# Patient Record
Sex: Male | Born: 1940 | Race: White | Hispanic: No | Marital: Married | Smoking: Former smoker
Health system: Southern US, Community
[De-identification: ages and names within clinical notes are randomized; demographics above are authoritative.]

---

## 2021-03-16 ENCOUNTER — Emergency Department: Payer: Medicare (Managed Care)

## 2021-03-16 ENCOUNTER — Emergency Department
Admission: EM | Admit: 2021-03-16 | Discharge: 2021-03-16 | Disposition: A | Discharge status: Home / Self Care | Payer: Medicare (Managed Care) | Attending: Emergency Medicine | Admitting: Emergency Medicine

## 2021-03-16 ENCOUNTER — Other Ambulatory Visit: Payer: Self-pay

## 2021-03-16 DIAGNOSIS — Z23 Encounter for immunization: Secondary | ICD-10-CM | POA: Insufficient documentation

## 2021-03-16 DIAGNOSIS — S0990XA Unspecified injury of head, initial encounter: Secondary | ICD-10-CM | POA: Diagnosis present

## 2021-03-16 DIAGNOSIS — Z7901 Long term (current) use of anticoagulants: Secondary | ICD-10-CM | POA: Insufficient documentation

## 2021-03-16 DIAGNOSIS — I2510 Atherosclerotic heart disease of native coronary artery without angina pectoris: Secondary | ICD-10-CM | POA: Diagnosis not present

## 2021-03-16 DIAGNOSIS — G309 Alzheimer's disease, unspecified: Secondary | ICD-10-CM | POA: Diagnosis not present

## 2021-03-16 DIAGNOSIS — W01198A Fall on same level from slipping, tripping and stumbling with subsequent striking against other object, initial encounter: Secondary | ICD-10-CM | POA: Diagnosis not present

## 2021-03-16 DIAGNOSIS — W19XXXA Unspecified fall, initial encounter: Secondary | ICD-10-CM

## 2021-03-16 DIAGNOSIS — S0101XA Laceration without foreign body of scalp, initial encounter: Secondary | ICD-10-CM | POA: Diagnosis not present

## 2021-03-16 DIAGNOSIS — Y92009 Unspecified place in unspecified non-institutional (private) residence as the place of occurrence of the external cause: Secondary | ICD-10-CM

## 2021-03-16 IMAGING — CR DG TIBIA/FIBULA 2V*L*
1 series · 3 of 3 positions shown · non-contrast
Comparison: None.

CLINICAL DATA: Fall, abrasion

EXAM:
LEFT TIBIA AND FIBULA - 2 VIEW

[Series 1: dg tibia/fibula left · 0.14mm/px · 3 of 3 slices shown]
[im 1/3]
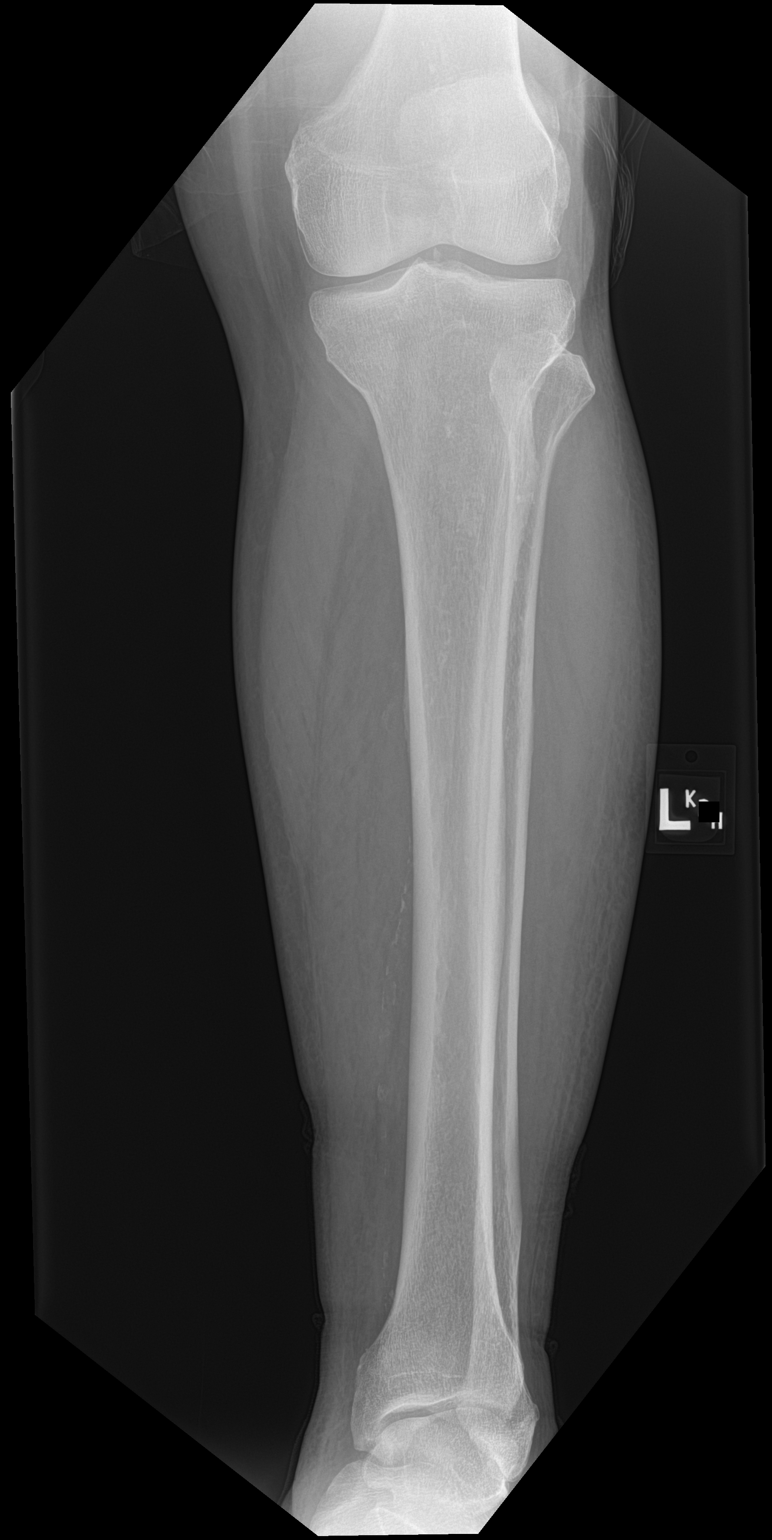
[im 2/3]
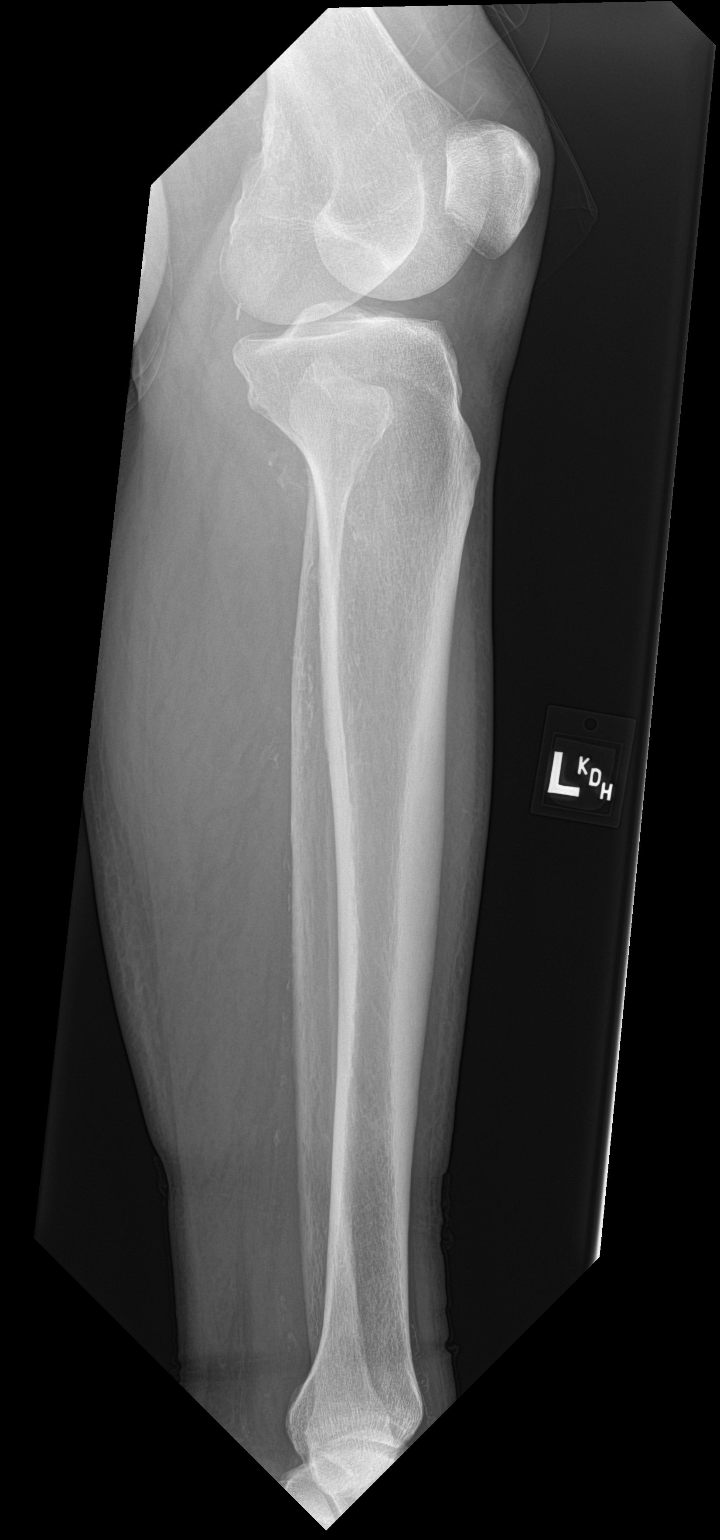
[im 3/3]
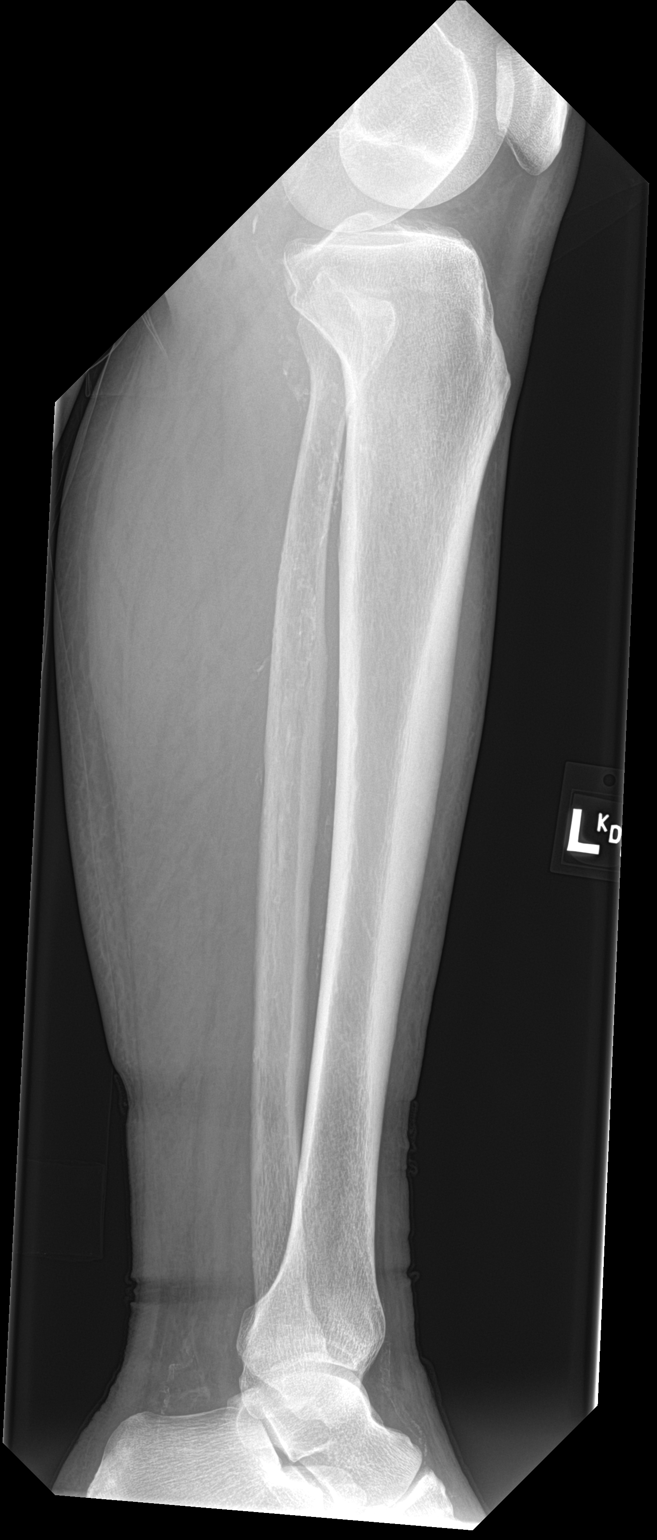

[3 of 3 positions shown; findings below may reference images not displayed]

FINDINGS: There is no acute fracture or dislocation. Knee alignment appears
anatomic. The joint spaces appear preserved. The soft tissues are
unremarkable. There is no radiopaque foreign body.
IMPRESSION: No acute abnormality.

## 2021-03-16 IMAGING — CT CT CERVICAL SPINE W/O CM
4 of 5 series · 14 of 33 positions shown, 16 images · non-contrast
Comparison: None.

CLINICAL DATA: Trauma

EXAM:
CT CERVICAL SPINE WITHOUT CONTRAST
TECHNIQUE: Multidetector CT imaging of the cervical spine was performed without
intravenous contrast. Multiplanar CT image reconstructions were also
generated.

[Series 4: c spine soft · axial · 0.30mm/px · z∈[+488,+534]mm · 2 of 91 slices shown]
[im 23/91  soft-tissue]
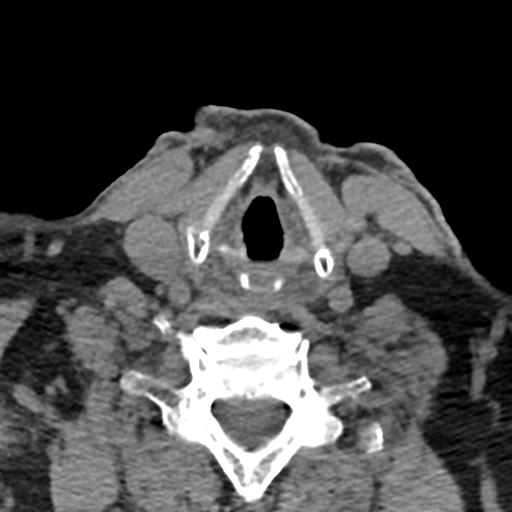
[im 46/91  soft-tissue]
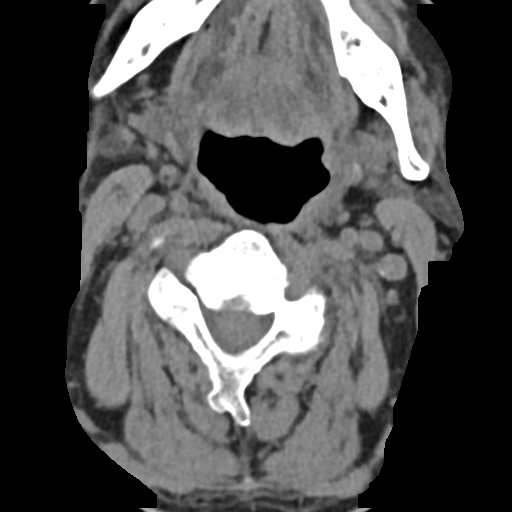

[Series 5: sagittal bone · sagittal · 0.54mm/px · 5 of 87 slices shown, 6 images]
[im 29/87  bone]
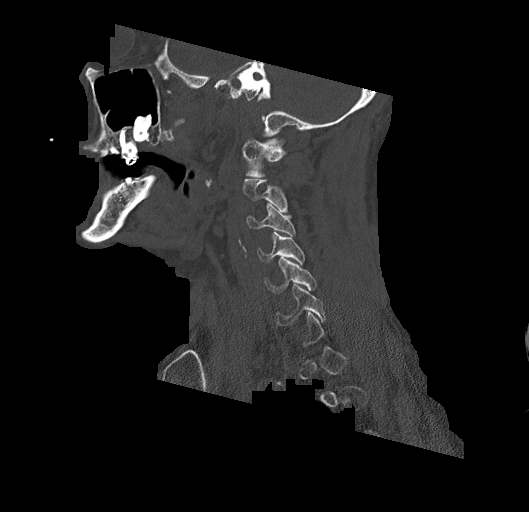
[im 36/87  bone]
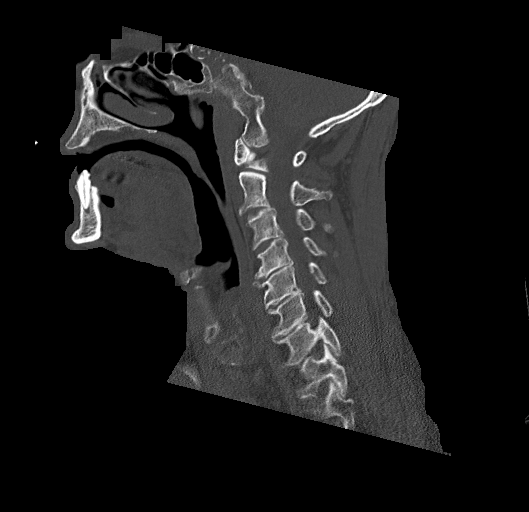
[im 44/87  soft-tissue]
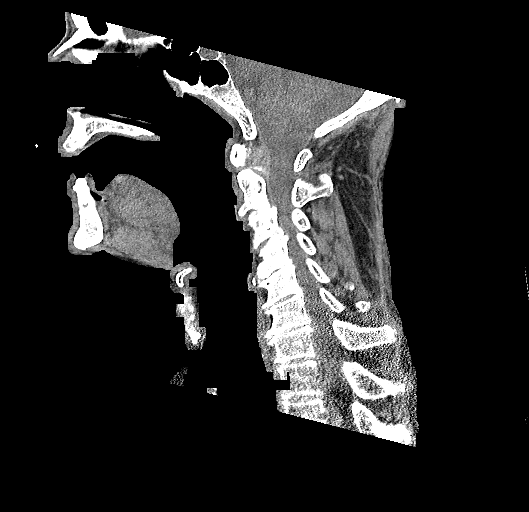
[im 44/87  bone]
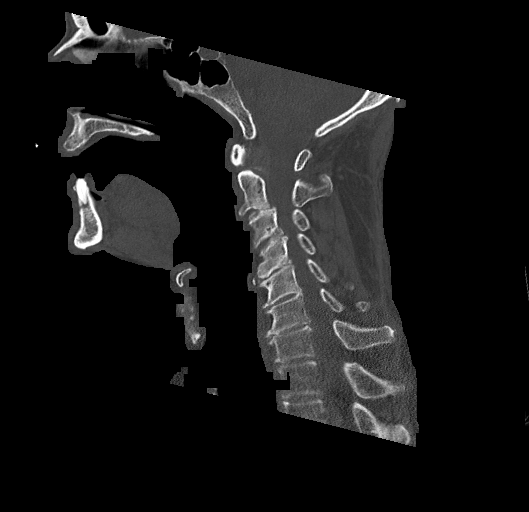
[im 51/87  bone]
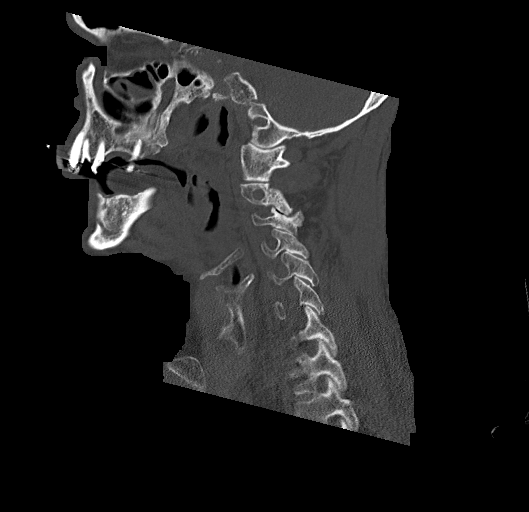
[im 58/87  bone]
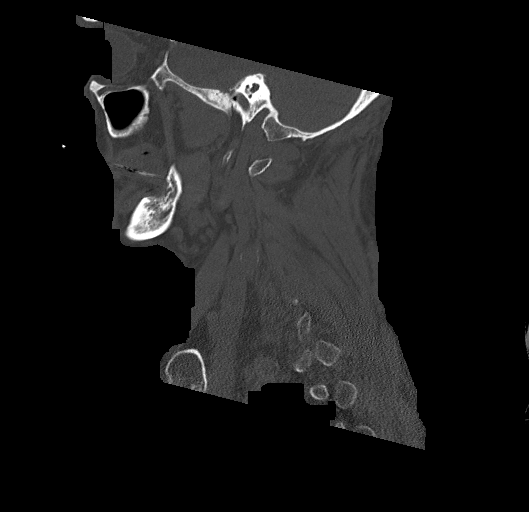

[Series 6: coronal bone · coronal · 0.38mm/px · 3 of 121 slices shown]
[im 32/121  bone]
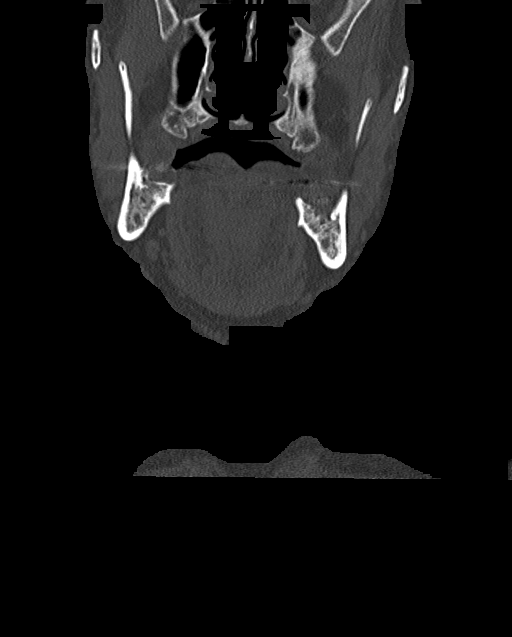
[im 51/121  bone]
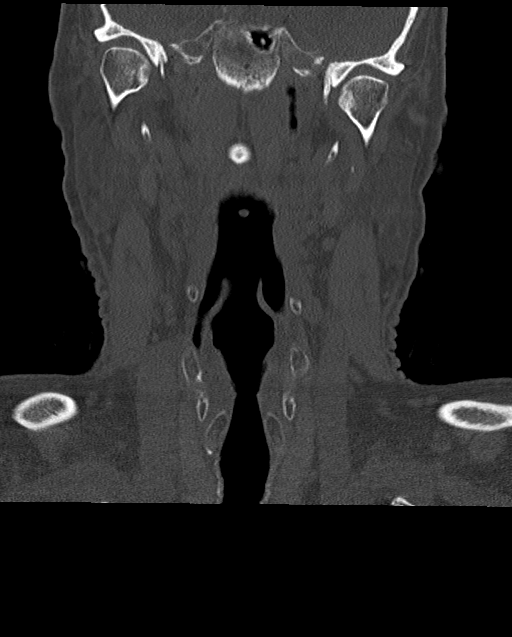
[im 70/121  bone]
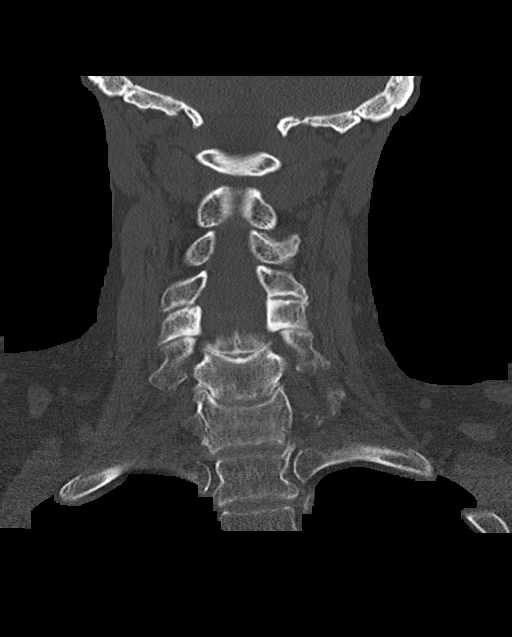

[Series 7: orthogonal bone · axial · 0.34mm/px · z∈[+416,+542]mm · 4 of 111 slices shown, 5 images]
[im 23/111  soft-tissue]
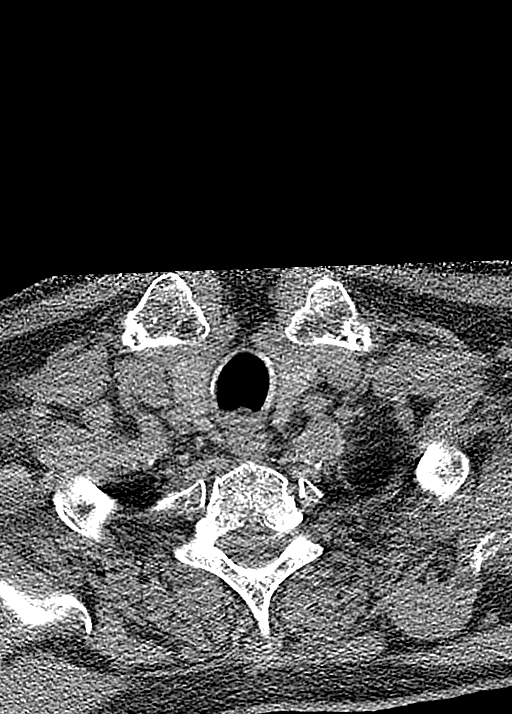
[im 23/111  bone]
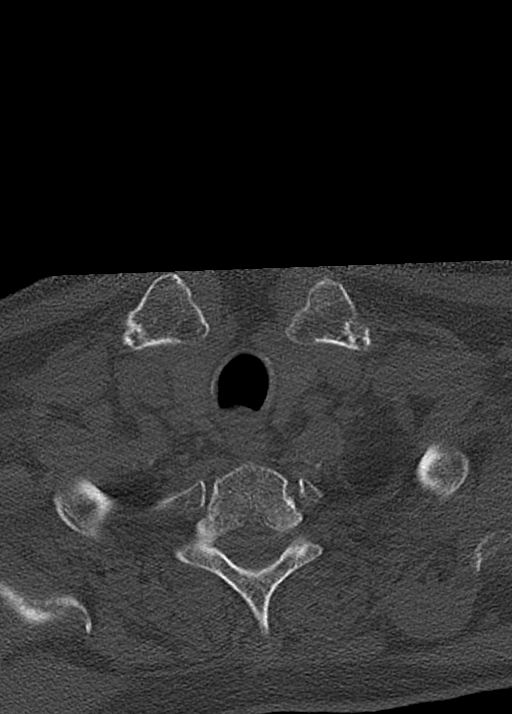
[im 45/111  bone]
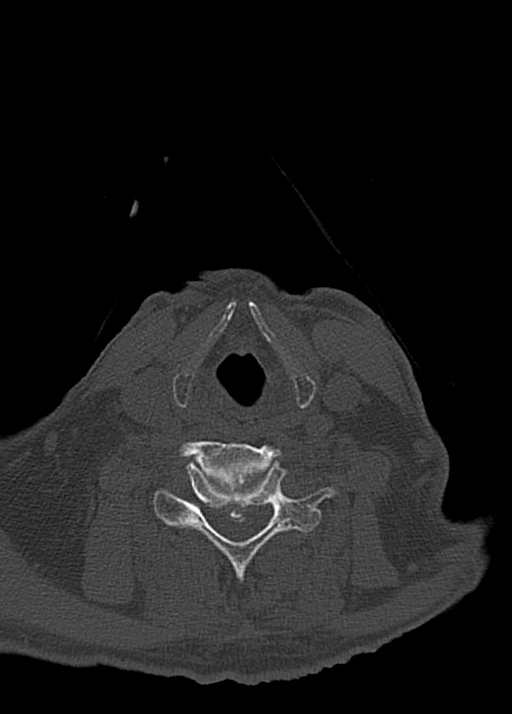
[im 67/111  bone]
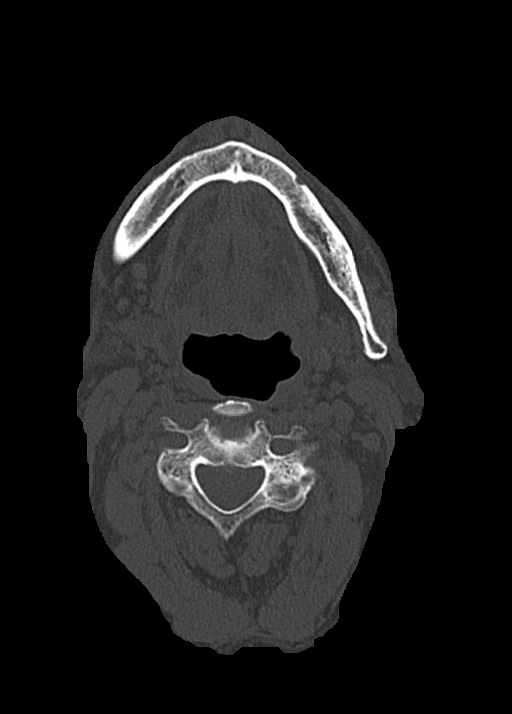
[im 89/111  bone]
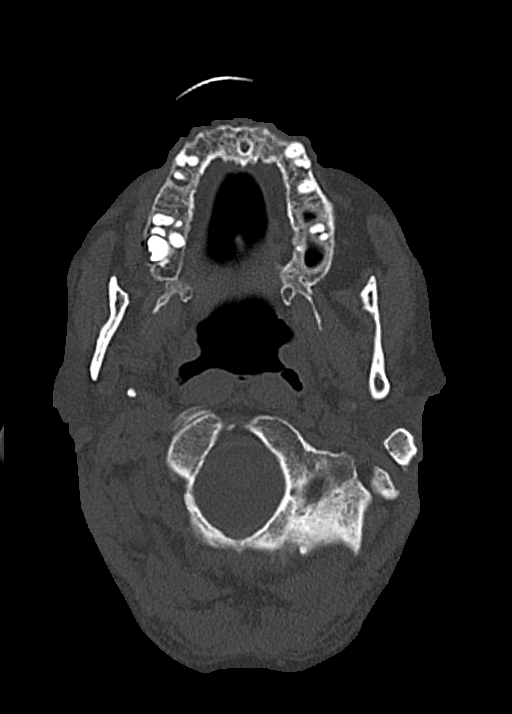

[14 of 33 positions shown; findings below may reference images not displayed]

FINDINGS: Alignment: There is straightening of the normal cervical spine
lordosis. There is trace anterolisthesis of C2 on C3, likely
degenerative in nature. There is no evidence of traumatic
malalignment. There is no jumped or perched facet.

Skull base and vertebrae: Skull base alignment is maintained.
Vertebral body heights are preserved. There is no evidence of acute
fracture.

Soft tissues and spinal canal: No prevertebral fluid or swelling. No
visible canal hematoma.

Disc levels: There is multilevel intervertebral disc space
narrowing. There is advanced left facet arthropathy at C2-C3. There
is multilevel uncovertebral arthropathy. There is a partially
calcified disc osteophyte complex at C5-C6 resulting in severe
narrowing of the canal with probable cord compression. There is
multilevel severe neural foraminal stenosis.

Upper chest: The lung apices are clear.

Other: Prominent right palatine tonsilliths are noted. Numerous
dental caries are noted.
IMPRESSION: 1. No acute fracture or traumatic malalignment of the cervical
spine.
2. Multilevel degenerative changes as above, most advanced at C5-C6
where a partially calcified disc osteophyte complex results in
severe spinal canal narrowing with probable cord compression. This
could be better evaluated with cervical spine MRI as indicated.
3. Extensive dental disease.

## 2021-03-16 IMAGING — CT CT HEAD W/O CM
3 series · 15 of 47 positions shown, 18 images · non-contrast
Comparison: None.

CLINICAL DATA: Trauma

EXAM:
CT HEAD WITHOUT CONTRAST
TECHNIQUE: Contiguous axial images were obtained from the base of the skull
through the vertex without intravenous contrast.

[Series 2: head wo · axial · 0.44mm/px · z∈[+614,+739]mm · 9 of 30 slices shown, 12 images]
[im 3/30  brain]
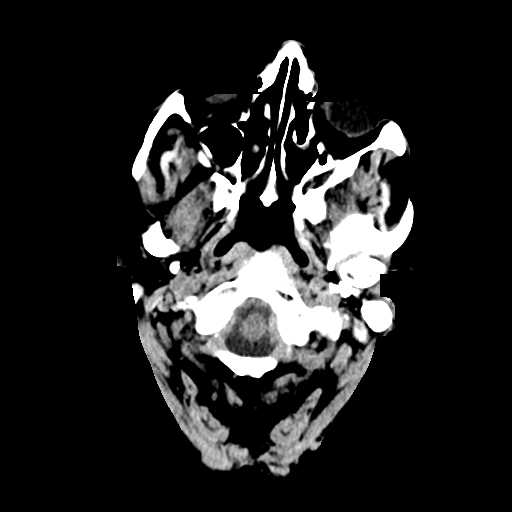
[im 3/30  bone]
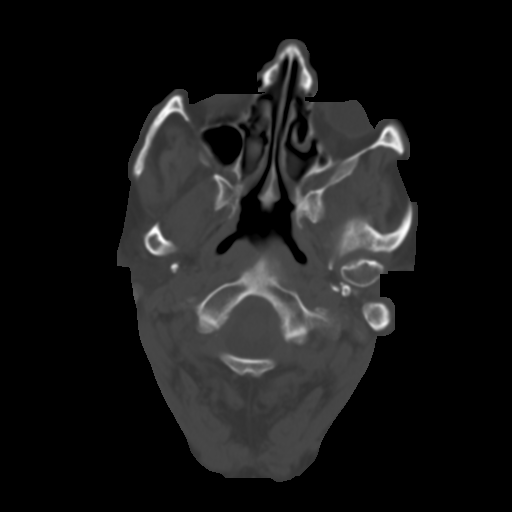
[im 6/30  brain]
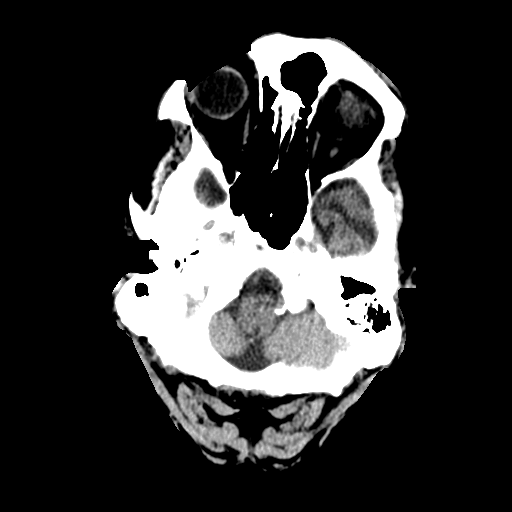
[im 9/30  brain]
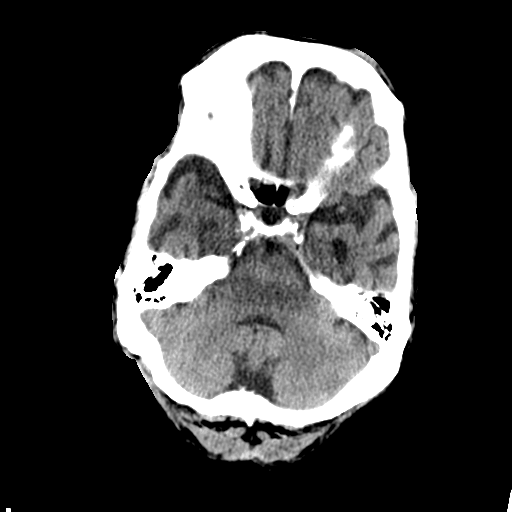
[im 12/30  brain]
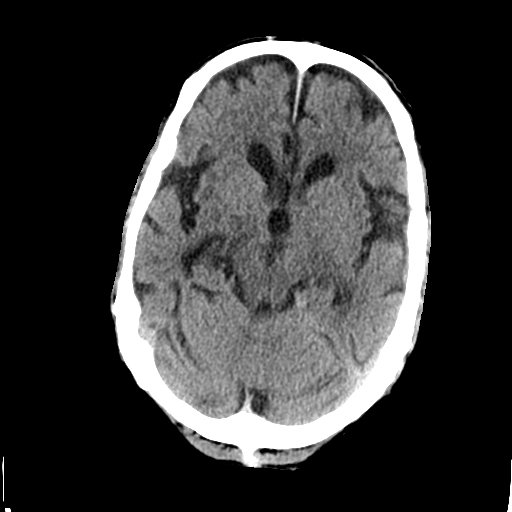
[im 16/30  brain]
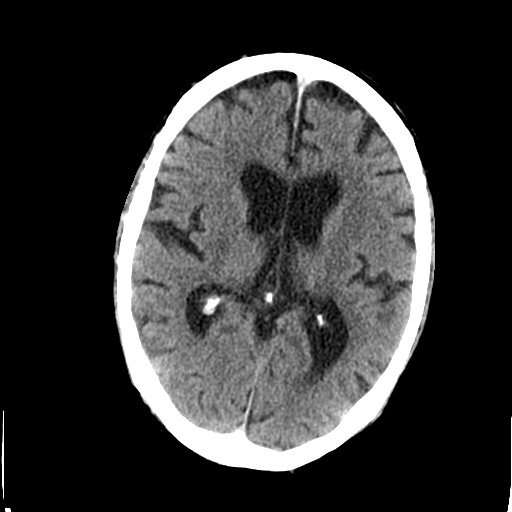
[im 16/30  bone]
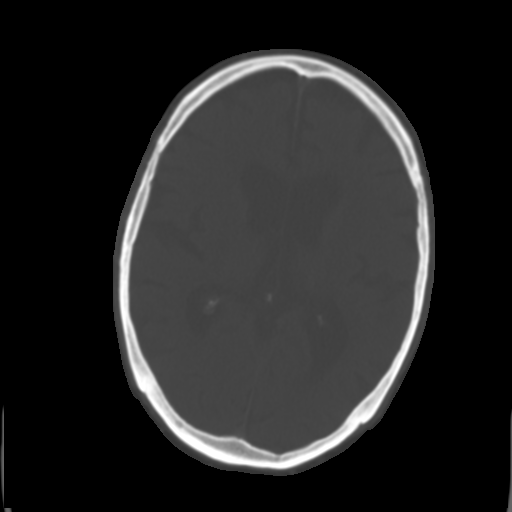
[im 19/30  brain]
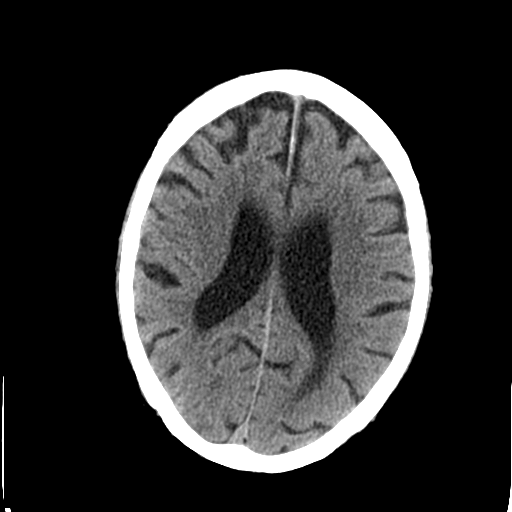
[im 22/30  brain]
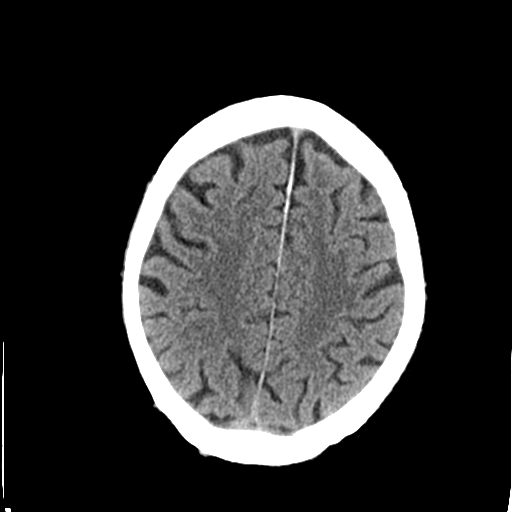
[im 25/30  brain]
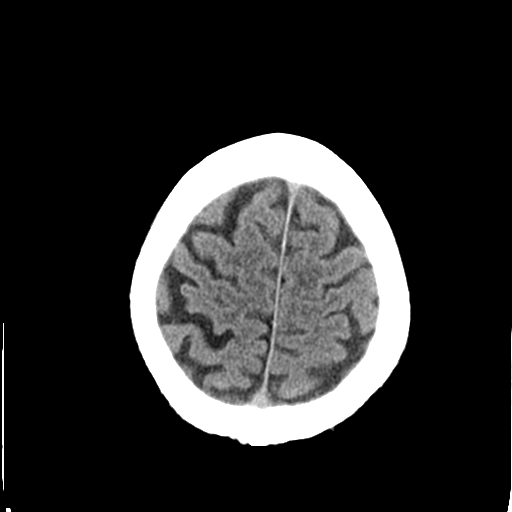
[im 28/30  brain]
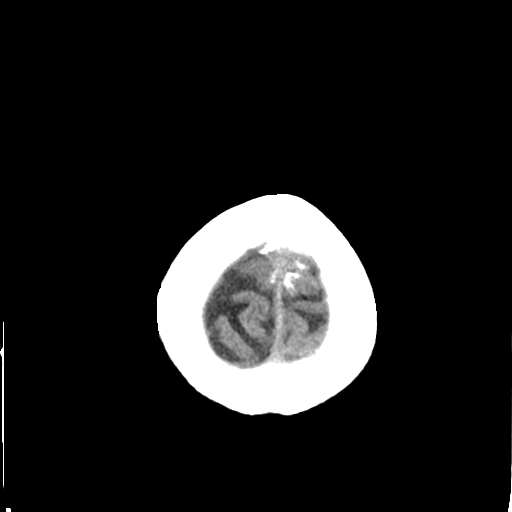
[im 28/30  bone]
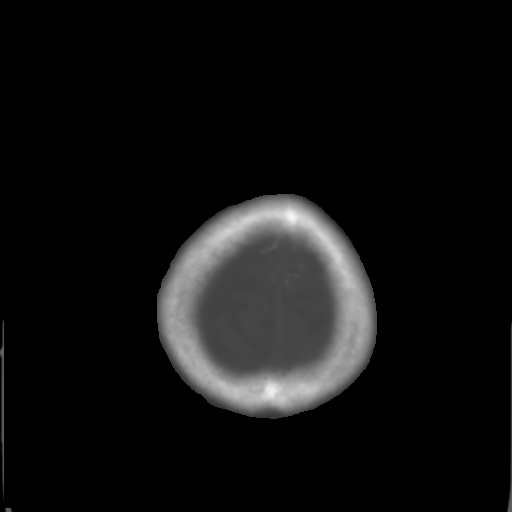

[Series 4: coronal soft tissue · coronal · 0.31mm/px · 3 of 68 slices shown]
[im 23/68  brain]
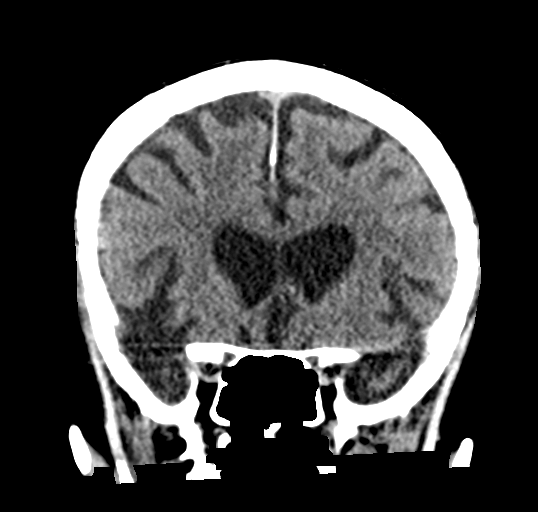
[im 30/68  brain]
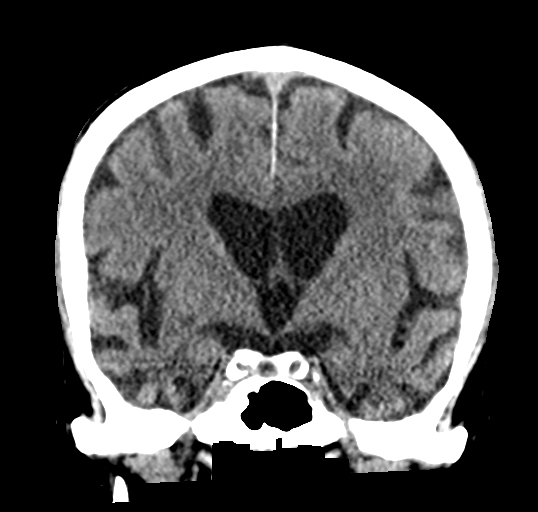
[im 38/68  brain]
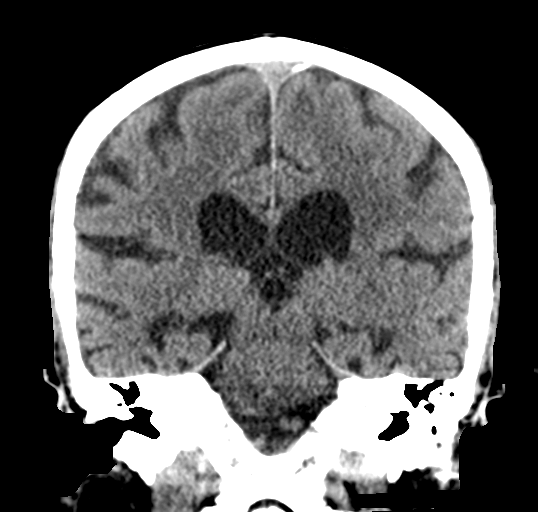

[Series 5: sagittal soft tissue · sagittal · 0.31mm/px · 3 of 52 slices shown]
[im 18/52  brain]
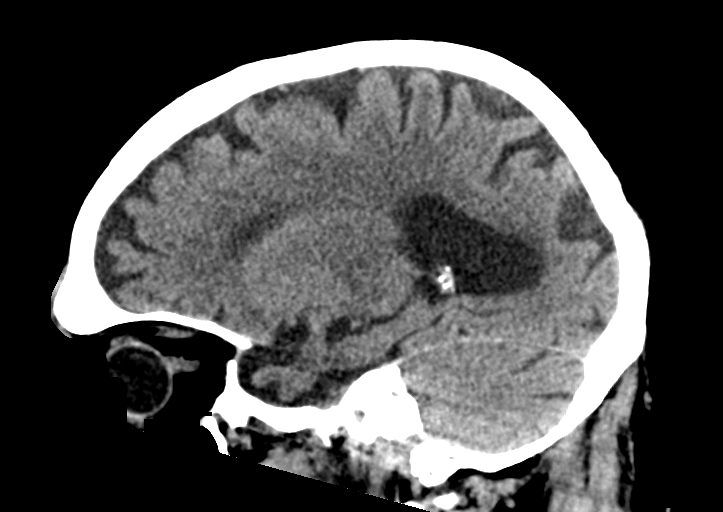
[im 26/52  brain]
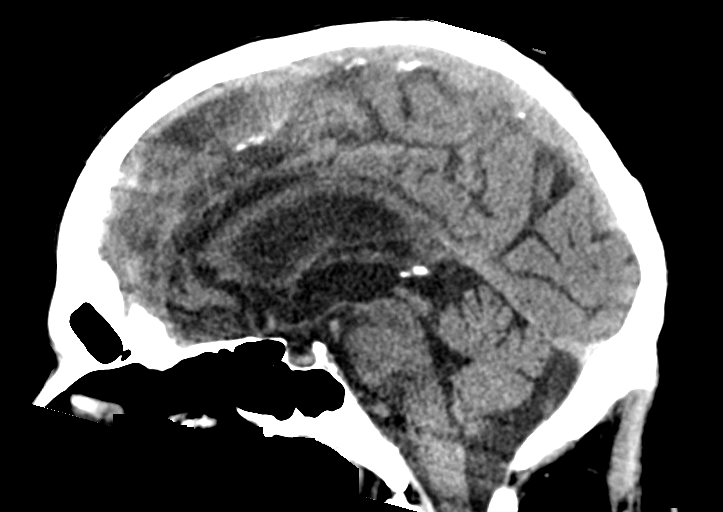
[im 35/52  brain]
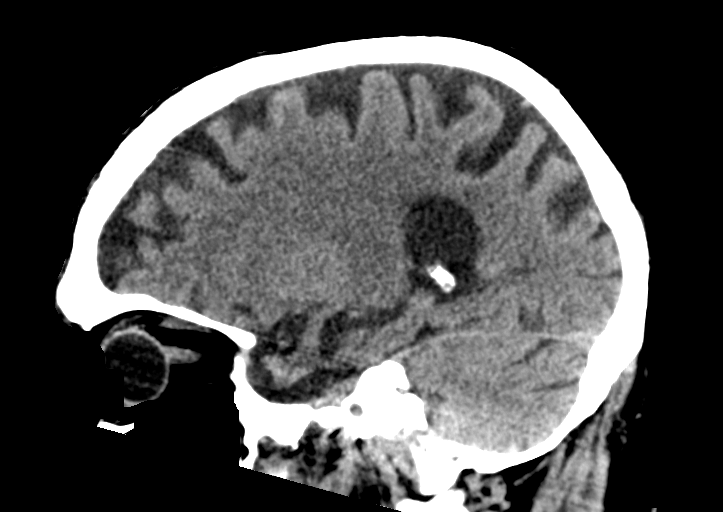

[15 of 47 positions shown; findings below may reference images not displayed]

FINDINGS: Brain: There is no evidence of acute intracranial hemorrhage,
extra-axial fluid collection, or infarct.

There is global parenchymal volume loss with commensurate
enlargement of the ventricular system. There is mild chronic white
matter microangiopathy. There is no mass lesion. There is no midline
shift.

Vascular: There is calcification of the bilateral cavernous ICAs and
vertebral arteries.

Skull: Normal. Negative for fracture or focal lesion.

Sinuses/Orbits: No acute finding.  A left lens implant is noted.

Other: None.
IMPRESSION: No acute intracranial hemorrhage or calvarial fracture.

## 2021-03-16 MED ORDER — TETANUS-DIPHTH-ACELL PERTUSSIS 5-2.5-18.5 LF-MCG/0.5 IM SUSY
0.5000 mL | PREFILLED_SYRINGE | Freq: Once | INTRAMUSCULAR | Status: AC
  Administered 2021-03-16: 0.5 mL via INTRAMUSCULAR
  Filled 2021-03-16: qty 0.5

## 2021-03-16 MED ORDER — LIDOCAINE HCL (PF) 1 % IJ SOLN
5.0000 mL | Freq: Once | INTRAMUSCULAR | Status: AC
  Administered 2021-03-16: 5 mL via INTRADERMAL
  Filled 2021-03-16: qty 5

## 2021-03-16 NOTE — ED Triage Notes (Signed)
Pt in via EMS from home with c/o fall after losing his balance. Pt hit head and has laceration above left eye, bleeding controlled. Pt takes plavix, no LOC

## 2021-03-16 NOTE — ED Notes (Signed)
Patient to room from CT via wheelchair.

## 2021-03-16 NOTE — Discharge Instructions (Addendum)
You forehead laceration was repaired with absorbable stitches. This will disintegrate on their own in about 2 weeks.

## 2021-03-16 NOTE — ED Notes (Signed)
ED Provider at bedside. 

## 2021-03-16 NOTE — ED Triage Notes (Signed)
Pt from home via [facility] with c/o fall, he was going out to his carport and tripped coming off the stairs. Denies any LOC he hit his head he does take blood thinners (just came off of Eliquist but currently on Plavix) Bleeding is under control on head. Does have an abrasion on his left lower leg also.
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
## 2021-04-16 ENCOUNTER — Encounter (HOSPITAL_COMMUNITY): Payer: Self-pay | Admitting: Radiology

## 2021-05-22 ENCOUNTER — Other Ambulatory Visit: Payer: Self-pay | Admitting: Adult Health

## 2021-05-22 DIAGNOSIS — R296 Repeated falls: Secondary | ICD-10-CM

## 2021-05-23 ENCOUNTER — Encounter: Payer: Self-pay | Admitting: Urology

## 2021-05-23 ENCOUNTER — Other Ambulatory Visit: Payer: Self-pay

## 2021-05-23 ENCOUNTER — Ambulatory Visit (INDEPENDENT_AMBULATORY_CARE_PROVIDER_SITE_OTHER): Payer: Medicare (Managed Care) | Admitting: Urology

## 2021-05-23 VITALS — BP 118/64 | HR 71 | Ht 65.0 in | Wt 170.0 lb

## 2021-05-23 DIAGNOSIS — N401 Enlarged prostate with lower urinary tract symptoms: Secondary | ICD-10-CM | POA: Diagnosis not present

## 2021-05-23 DIAGNOSIS — N5089 Other specified disorders of the male genital organs: Secondary | ICD-10-CM

## 2021-05-23 LAB — BLADDER SCAN AMB NON-IMAGING: Scan Result: 26

## 2021-05-23 NOTE — Progress Notes (Signed)
05/23/2021 10:25 AM   [facility] 1941/05/10 [id number]  Referring provider: Malachi Paradise, NP [facility] [address] [place],  [place] [address]  Chief Complaint  Patient presents with   Urinary Incontinence    HPI: Christopher Cortez is a 80 y.o. male referred for urge incontinence.  He presents today with his wife.  Patient with late stage Alzheimer's dementia On tamsulosin 0.4 mg daily His wife states he has frequency, urgency and nocturia x3-4 She states the symptoms are not that bothersome No dysuria or gross hematuria No flank, abdominal or pelvic pain She is more concerned about left hemiscrotal swelling which has been present for several months.  He was hospitalized at [facility] May 2022 and a scrotal ultrasound was performed which showed significant scrotal wall thickening, small bilateral hydroceles and a septated cyst of the left testis.  61-month follow-up ultrasound was recommended.  He has no pain or discomfort   PMH:  Allergic rhinitis due to allergen   Cataract cortical, senile   Chronic congestive heart failure (CMS-HCC) 09/22/2018   GERD (gastroesophageal reflux disease) 01/17/2015   Surgical History: No past surgical history on file  Home Medications:  Allergies as of 05/23/2021   No Known Allergies      Medication List        Accurate as of May 23, 2021 10:25 AM. If you have any questions, ask your nurse or doctor.          atorvastatin 80 MG tablet Commonly known as: LIPITOR Take 80 mg by mouth daily.   clopidogrel 75 MG tablet Commonly known as: PLAVIX Take 75 mg by mouth daily.   lisinopril 5 MG tablet Commonly known as: ZESTRIL Take 5 mg by mouth daily.   metoprolol succinate 25 MG 24 hr tablet Commonly known as: TOPROL-XL Take 25 mg by mouth daily.   mirtazapine 7.5 MG tablet Commonly known as: REMERON Take 7.5 mg by mouth at bedtime.   senna 8.6 MG Tabs tablet Commonly known as: SENOKOT Take 2 tablets by  mouth daily.   spironolactone 25 MG tablet Commonly known as: ALDACTONE Take by mouth.   tamsulosin 0.4 MG Caps capsule Commonly known as: FLOMAX Take by mouth.   traZODone 50 MG tablet Commonly known as: DESYREL Take 25 mg by mouth at bedtime as needed.   Ventolin HFA 108 (90 Base) MCG/ACT inhaler Generic drug: albuterol SMARTSIG:2 Inhalation Via Inhaler Every 6 Hours PRN        Allergies: No Known Allergies  Family History: No family history on file.  Social History:  has no history on file for tobacco use, alcohol use, and drug use.   Physical Exam: BP 118/64   Pulse 71   Ht 5\' 5"  (1.651 m)   Wt 170 lb (77.1 kg)   BMI 28.29 kg/m   Constitutional:  Alert, No acute distress. HEENT: [place] AT, moist mucus membranes.  Trachea midline, no masses. Cardiovascular: No clubbing, cyanosis, or edema. Respiratory: Normal respiratory effort, no increased work of breathing. GI: Abdomen is soft, nontender, nondistended, no abdominal masses GU: Marked left hemiscrotal swelling which on exam is more consistent with hydrocele.  The cord appears raising the possibility of a hernia Skin: No rashes, bruises or suspicious lesions.   Assessment & Plan:    1. Urge incontinence Bladder scan PVR 26 mL He is on tamsulosin Based on age and Alzheimer's dementia would not recommend anticholinergic medication due to side effect of cognitive impairment Could consider a beta 3  agonist however his wife is not sure they want to add another medication and will think over this option  2.  Left scrotal swelling Prior ultrasound May 2022 showed a septated cyst and scrotal wall thickening.  Exam today is more consistent with hydrocele versus hernia Recommend scheduling follow-up scrotal ultrasound and will call with results    Abbie Sons, [place] [address] [place], [place] [address] [phone]

## 2021-06-05 ENCOUNTER — Ambulatory Visit: Payer: Medicare (Managed Care)

## 2021-06-05 ENCOUNTER — Ambulatory Visit
Admission: RE | Admit: 2021-06-05 | Discharge: 2021-06-05 | Disposition: A | Discharge status: Home / Self Care | Payer: Medicare (Managed Care) | Source: Ambulatory Visit | Attending: Adult Health | Admitting: Adult Health

## 2021-06-05 ENCOUNTER — Other Ambulatory Visit: Payer: Self-pay

## 2021-06-05 DIAGNOSIS — R296 Repeated falls: Secondary | ICD-10-CM

## 2021-06-05 IMAGING — MR MR THORACIC SPINE W/O CM
6 series · 35 of 48 positions shown · non-contrast
Comparison: None.

CLINICAL DATA: Several falls daily over the last 6 weeks.

EXAM:
MRI THORACIC SPINE WITHOUT CONTRAST
TECHNIQUE: Multiplanar, multisequence MR imaging of the thoracic spine was
performed. No intravenous contrast was administered.

[Series 16: T1 · sagittal · 5.0mm · 1.88mm/px · 4 of 11 slices shown (1 of 2)]
[im 1/11]
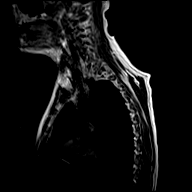
[im 4/11]
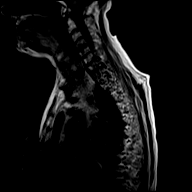
[im 7/11]
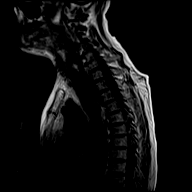
[im 11/11]
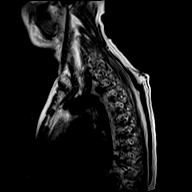

[Series 17: T2 · sagittal · 3.0mm · 1.33mm/px · 6 of 17 slices shown (1 of 2)]
[im 1/17]
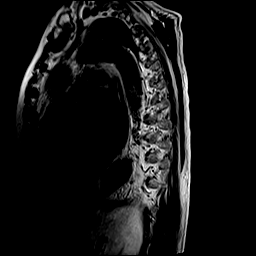
[im 4/17]
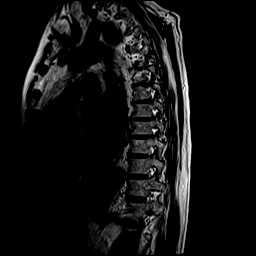
[im 7/17]
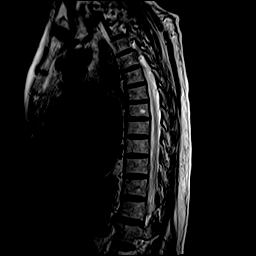
[im 10/17]
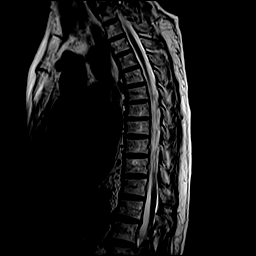
[im 13/17]
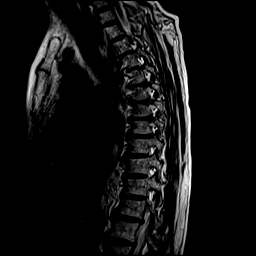
[im 17/17]
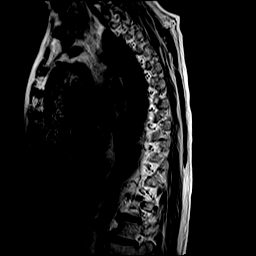

[Series 18: T1 · sagittal · 3.0mm · 1.33mm/px · 6 of 17 slices shown (2 of 2)]
[im 1/17]
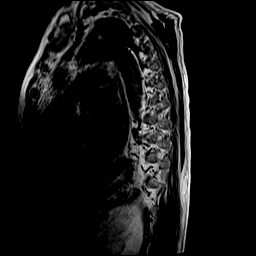
[im 4/17]
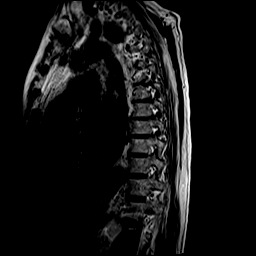
[im 7/17]
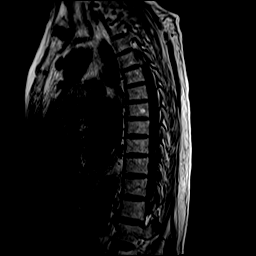
[im 10/17]
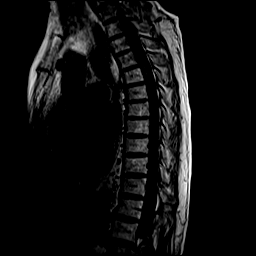
[im 13/17]
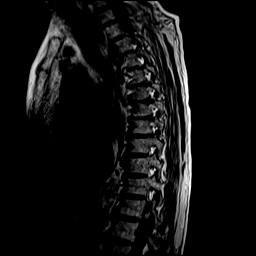
[im 17/17]
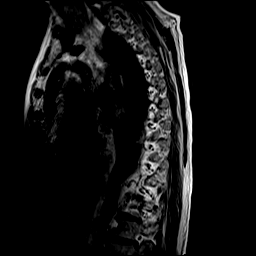

[Series 19: STIR · sagittal · 3.0mm · 0.66mm/px · 6 of 17 slices shown]
[im 1/17]
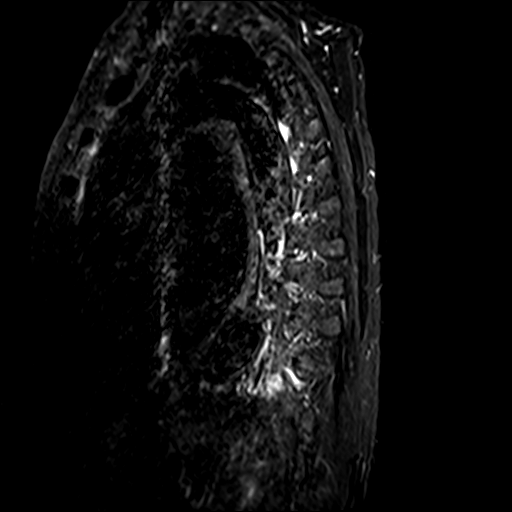
[im 4/17]
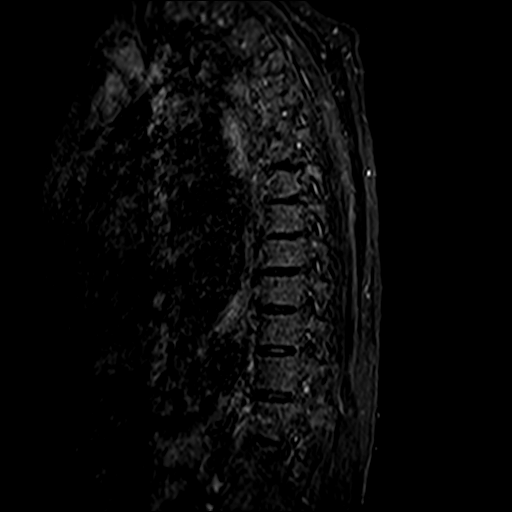
[im 7/17]
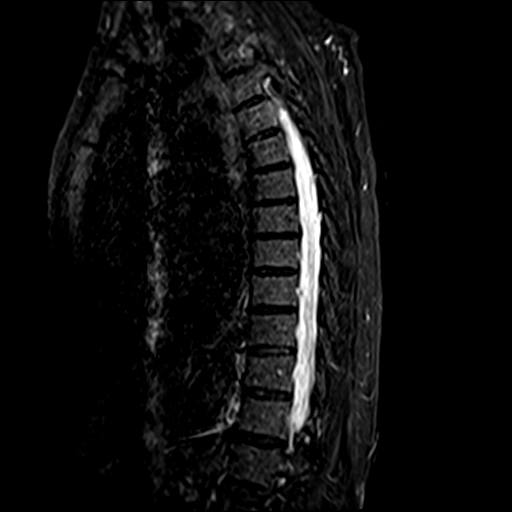
[im 10/17]
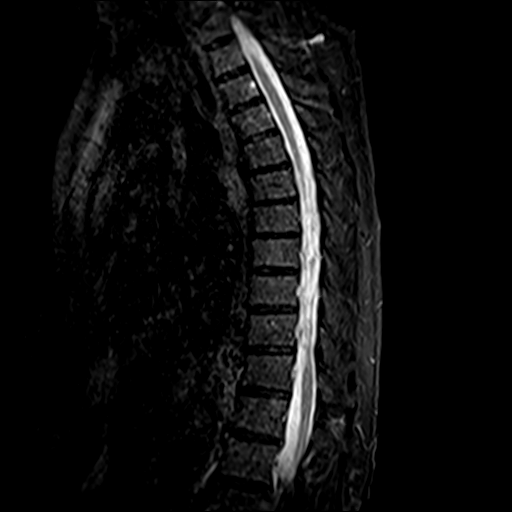
[im 13/17]
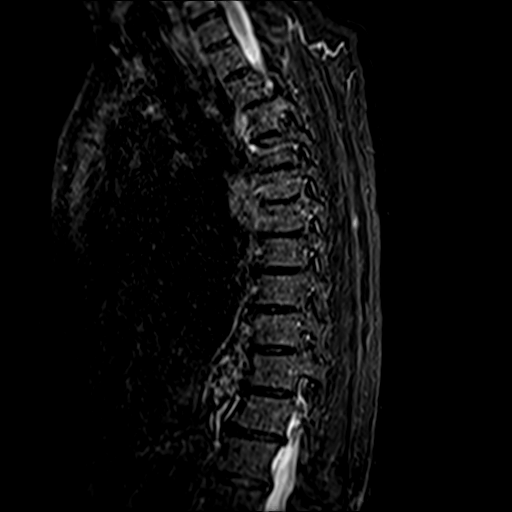
[im 17/17]
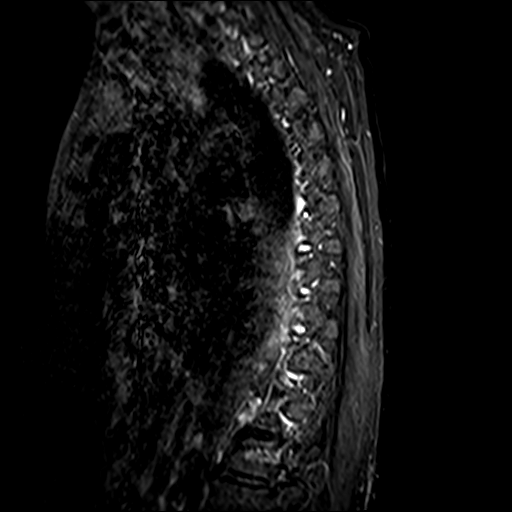

[Series 20: T2 · axial · 4.0mm · 0.59mm/px · z∈[-424,-215]mm · 9 of 39 slices shown (2 of 2)]
[im 1/39]
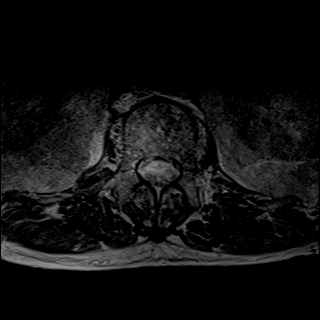
[im 7/39]
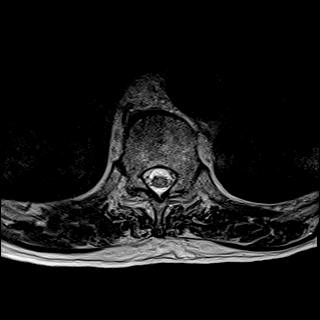
[im 13/39]
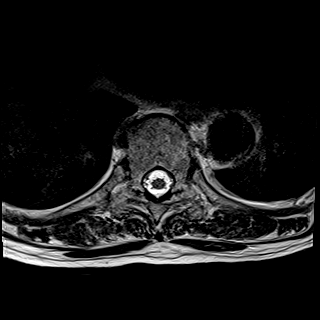
[im 16/39]
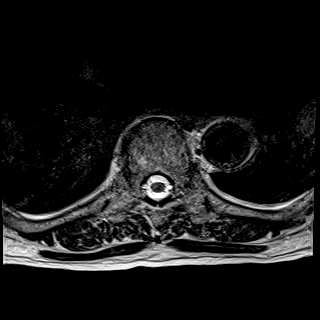
[im 20/39]
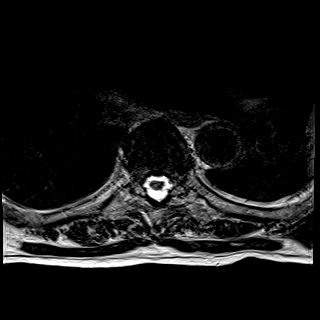
[im 23/39]
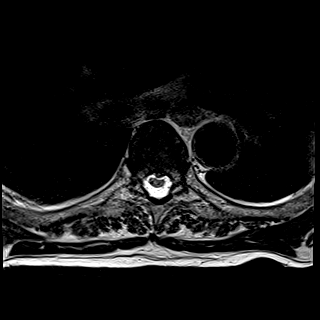
[im 26/39]
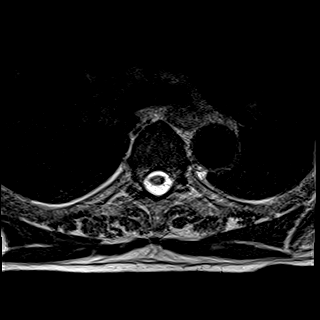
[im 32/39]
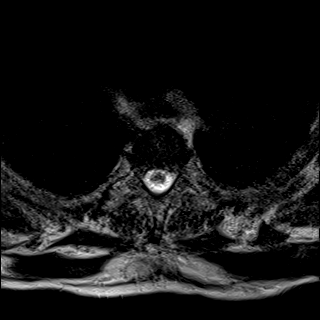
[im 39/39]
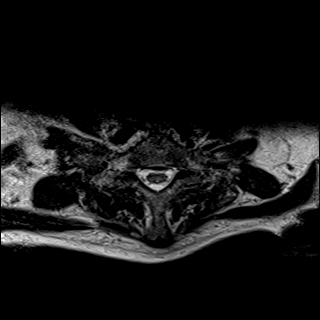

[Series 21: GRE · axial · 4.0mm · 0.37mm/px · z∈[-424,-321]mm · 4 of 39 slices shown]
[im 1/39]
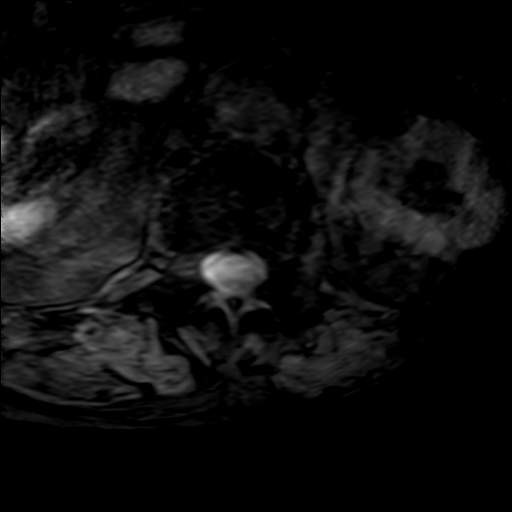
[im 7/39]
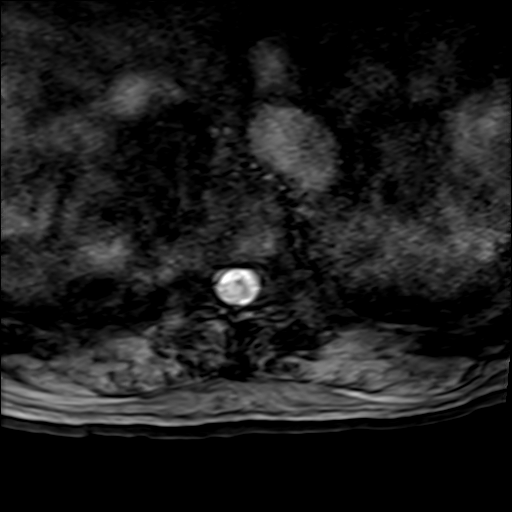
[im 13/39]
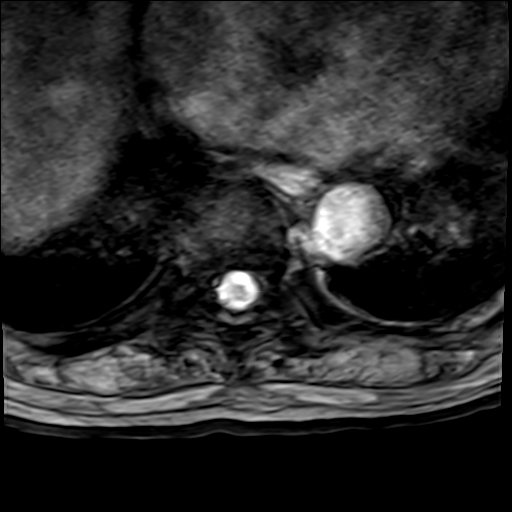
[im 16/39]
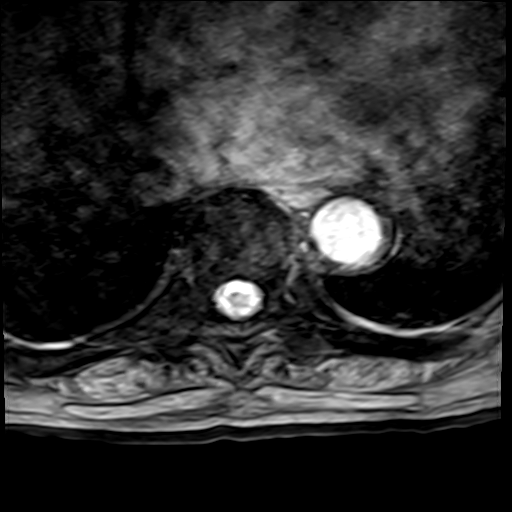

[35 of 48 positions shown; findings below may reference images not displayed]

FINDINGS: Despite efforts by the technologist and patient, motion artifact is
present on today's exam and could not be eliminated. This reduces
exam sensitivity and specificity.

Alignment: Minimal levoconvex thoracic scoliosis. No subluxation
identified.

Vertebrae: Disc desiccation throughout the thoracic spine. No
thoracic spine fracture or acute bony findings.

Cord:  No significant abnormal spinal cord signal is observed.

Paraspinal and other soft tissues: Unremarkable

Disc levels:

T1-2: Unremarkable.

T2-3: Unremarkable.

T3-4: No impingement.  Shallow left paracentral disc protrusion.

T4-5: Unremarkable.

T5-6: Left paracentral disc protrusion thins the ventral
subarachnoid space and slightly indents the left anterior cord, but
with plenty of CSF space posterior to the cord at this level.
Finding shown on image 17 series 20.

T6-7: No impingement.  Small central disc protrusion.

T7-8: Unremarkable.

T8-9: Unremarkable.

T9-10: Unremarkable.

T10-11: Unremarkable.

T11-12: Unremarkable.

T12-L1: No impingement. Disc bulge and right paracentral disc
protrusion.
IMPRESSION: 1. No fracture or acute findings identified. No specific cord
abnormality observed.
2. Left paracentral disc protrusion at T5-6 density ventral
subarachnoid space and slightly indents the left anterior cord, but
with plenty of CSF space posterior to the cord at this level.
3. Degenerative disc disease at several additional levels, likewise
without overt impingement.
4. Minimal levoconvex thoracic scoliosis.

## 2021-06-05 IMAGING — MR MR CERVICAL SPINE W/O CM
5 of 6 series · 33 of 48 positions shown · non-contrast
Comparison: CT cervical spine [DATE]

CLINICAL DATA: Fall several times a day over the last 6 weeks.

EXAM:
MRI CERVICAL SPINE WITHOUT CONTRAST
TECHNIQUE: Multiplanar, multisequence MR imaging of the cervical spine was
performed. No intravenous contrast was administered.

[Series 1: T2 · sagittal · 3.0mm · 0.39mm/px · 6 of 15 slices shown (1 of 3)]
[im 1/15]
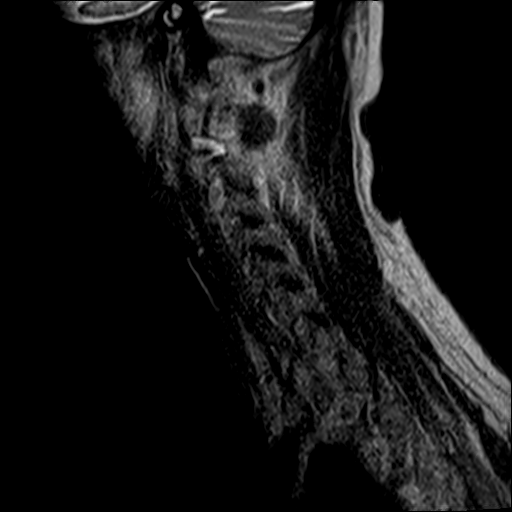
[im 3/15]
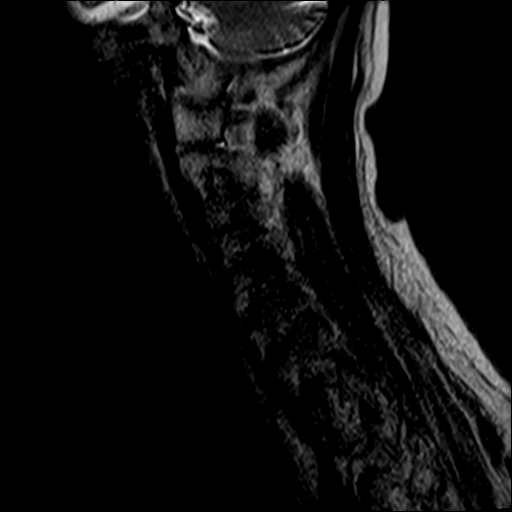
[im 6/15]
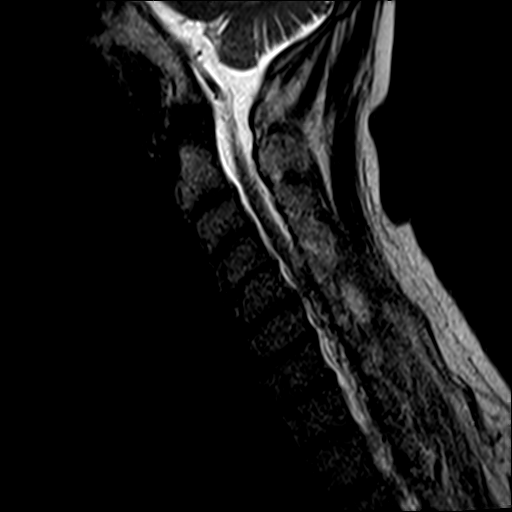
[im 9/15]
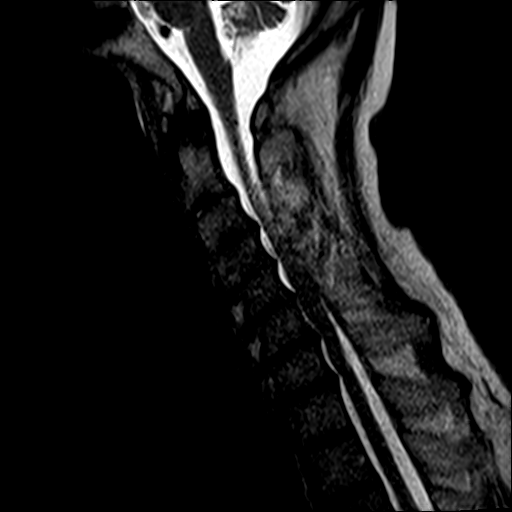
[im 12/15]
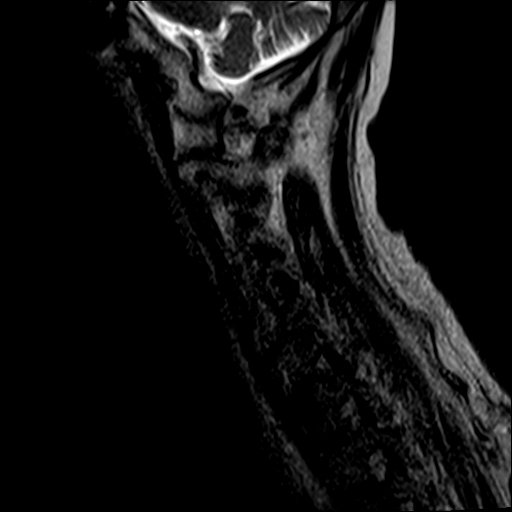
[im 15/15]
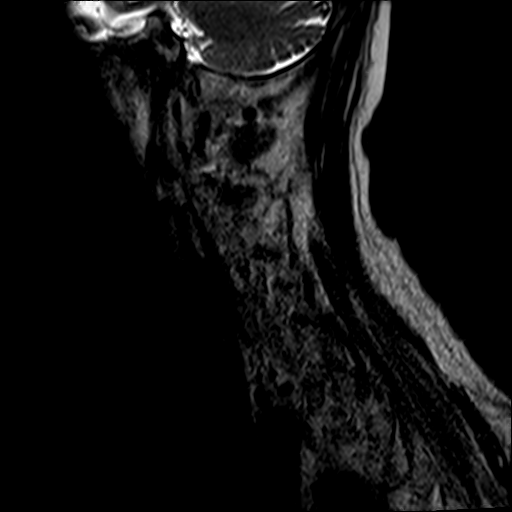

[Series 2: STIR · sagittal · 3.0mm · 0.39mm/px · 6 of 15 slices shown]
[im 1/15]
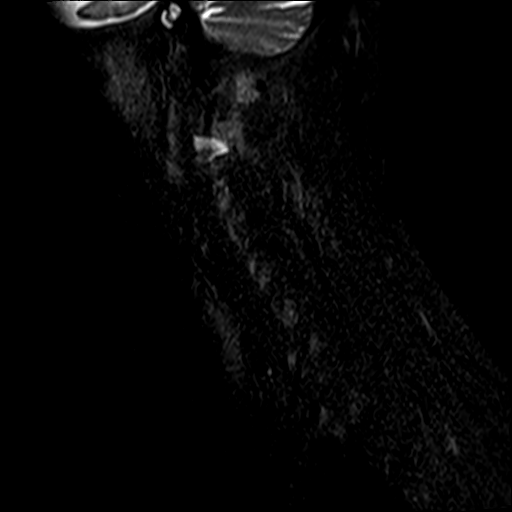
[im 3/15]
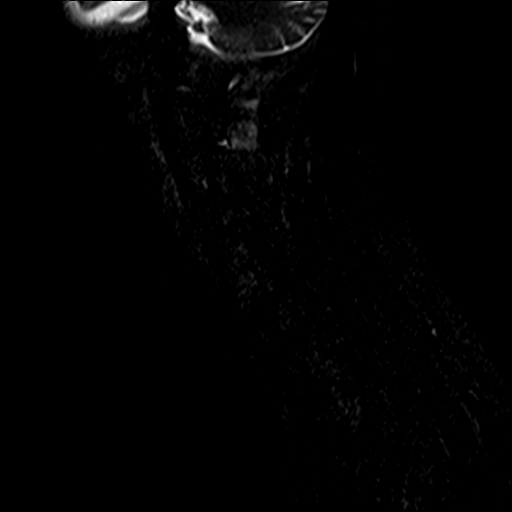
[im 6/15]
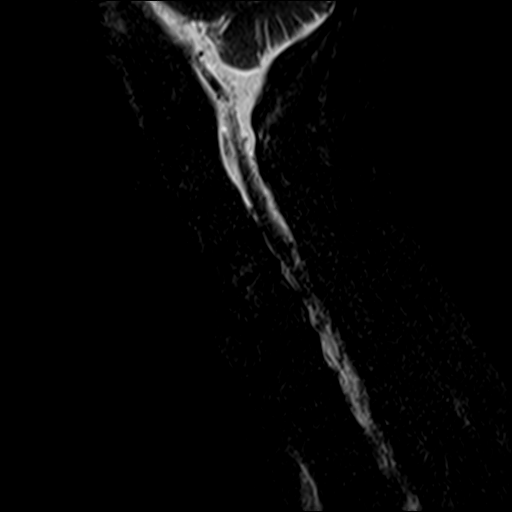
[im 9/15]
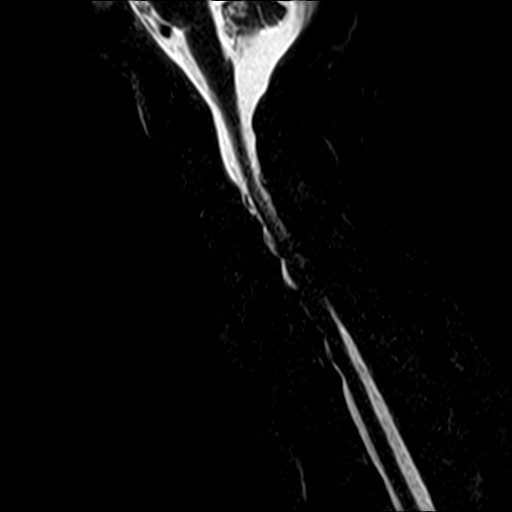
[im 12/15]
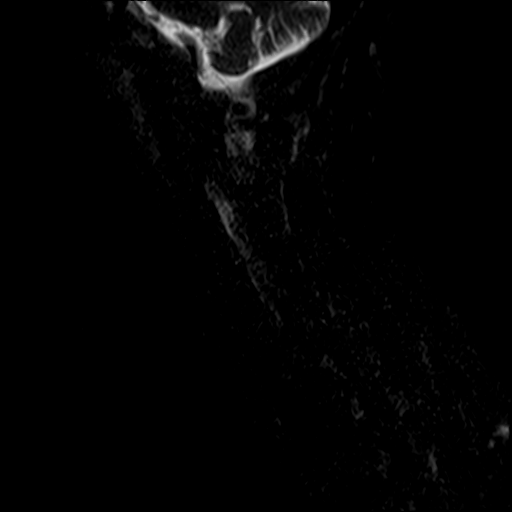
[im 15/15]
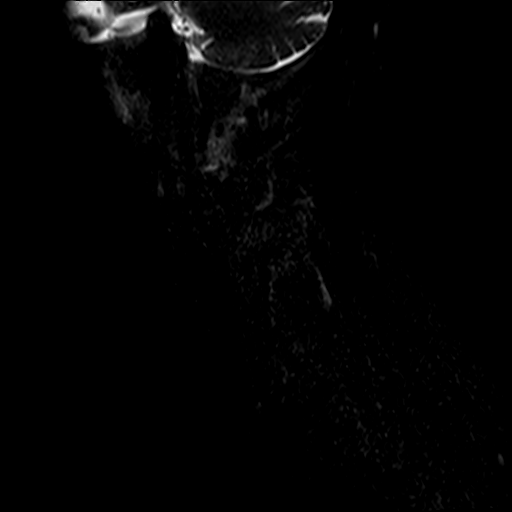

[Series 3: T2 · sagittal · 3.0mm · 0.78mm/px · 6 of 15 slices shown (2 of 3)]
[im 1/15]
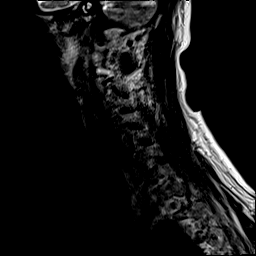
[im 3/15]
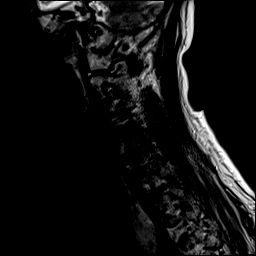
[im 6/15]
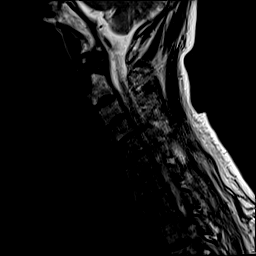
[im 9/15]
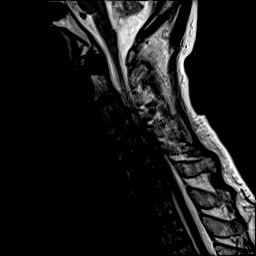
[im 12/15]
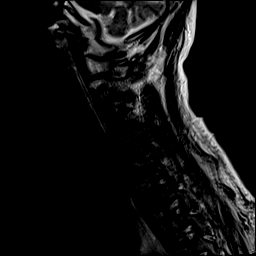
[im 15/15]
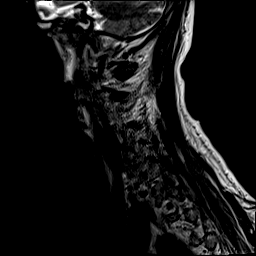

[Series 4: FLAIR · sagittal · 3.0mm · 0.78mm/px · 6 of 15 slices shown]
[im 1/15]
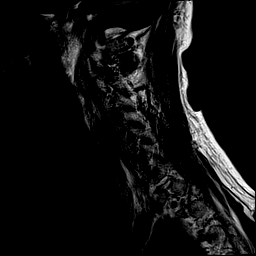
[im 3/15]
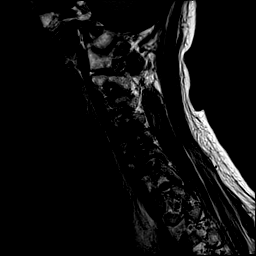
[im 6/15]
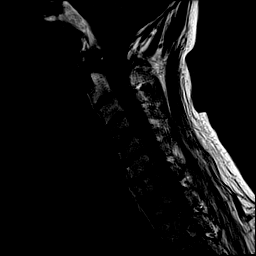
[im 9/15]
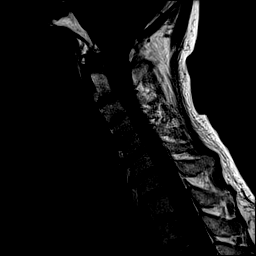
[im 12/15]
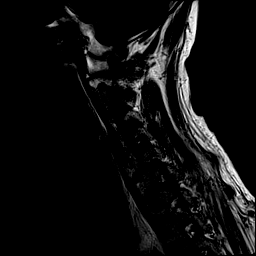
[im 15/15]
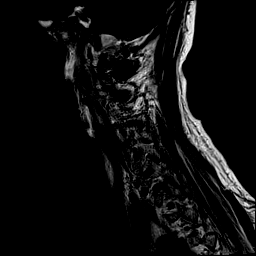

[Series 5: T2 · axial · 3.0mm · 0.86mm/px · z∈[-216,-124]mm · 9 of 29 slices shown (3 of 3)]
[im 1/29]
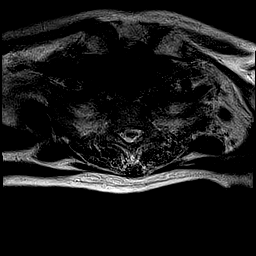
[im 6/29]
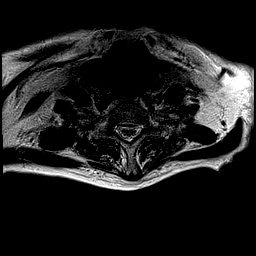
[im 8/29]
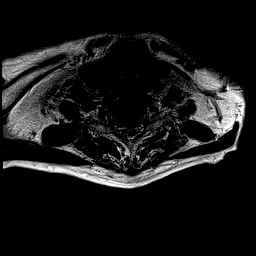
[im 13/29]
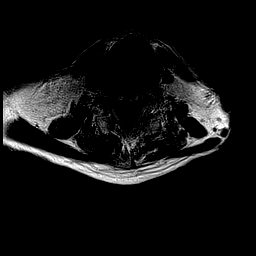
[im 16/29]
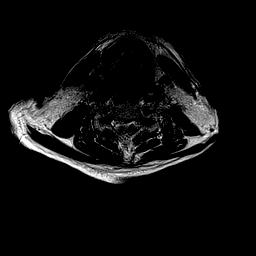
[im 21/29]
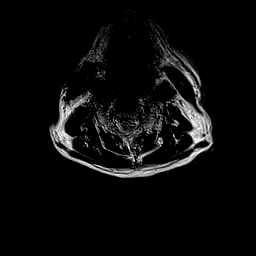
[im 23/29]
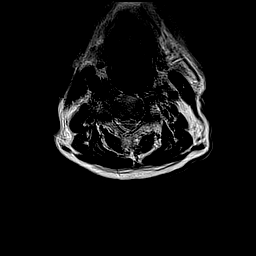
[im 26/29]
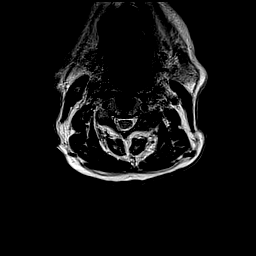
[im 29/29]
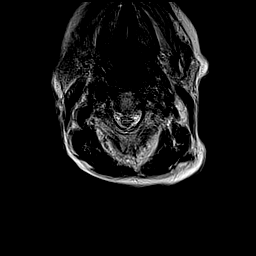

[33 of 48 positions shown; findings below may reference images not displayed]

FINDINGS: Despite efforts by the technologist and patient, severe motion
artifact is present on today's exam and could not be eliminated.
This reduces exam sensitivity and specificity.

Alignment: Straightening of the normal cervical lordosis. 1.5 mm
anterolisthesis at C2-3.

Vertebrae: Disc desiccation throughout the cervical spine with
substantial loss of disc height most severe at C4-5, C5-6, and C6-7.
Type 1 degenerative endplate findings at C4-5.

Cord: No definite cord signal abnormality is identified. Sensitivity
for subtle findings reduced by the degree of motion artifact.

Posterior Fossa, vertebral arteries, paraspinal tissues:
Unremarkable

Disc levels:

C2-3: No overt impingement.  Mild disc bulge.

C3-4: Moderate to prominent bilateral foraminal stenosis primarily
due to uncinate spurring.

C4-5: Suspected prominent bilateral foraminal stenosis due to
uncinate and facet spurring. Borderline central narrowing of the
thecal sac related to disc bulge and posterior osseous ridging.

C5-6: Prominent central narrowing of the thecal sac with moderate to
prominent bilateral foraminal stenosis due to central disc
protrusion, uncinate spurring, and facet spurring. The central disc
protrusion previously demonstrated calcification on the CT, and
narrows the AP diameter of the thecal sac to 0.6 cm.

C6-7: Moderate right and suspected mild left foraminal stenosis and
moderate central narrowing of the thecal sac due to central disc
protrusion, disc bulge, uncinate spurring, and facet arthropathy.

C7-T1: Borderline bilateral foraminal stenosis due to uncinate
spurring. Mild disc bulge.
IMPRESSION: 1. Cervical spondylosis and degenerative disc disease, causing
prominent impingement at C4-5 and C5-6; moderate to prominent
impingement at C3-4; and moderate impingement at C6-7, as detailed
above. Of these, the prominent central narrowing of the thecal sac
at C5-6 is of special note, where a chronic central disc protrusion
narrows the AP diameter of the thecal sac to 0.6 cm, indenting the
anterior aspect of the cord. I do not see definite abnormal cord
signal, but please note that images on today's exam are
substantially degraded by motion artifact which can reduce
sensitivity for subtle findings.

## 2021-06-15 ENCOUNTER — Other Ambulatory Visit: Payer: Medicare (Managed Care)

## 2021-06-25 ENCOUNTER — Other Ambulatory Visit: Payer: Self-pay

## 2021-06-25 ENCOUNTER — Ambulatory Visit
Admission: RE | Admit: 2021-06-25 | Discharge: 2021-06-25 | Disposition: A | Discharge status: Home / Self Care | Payer: Medicare (Managed Care) | Source: Ambulatory Visit | Attending: Urology | Admitting: Urology

## 2021-06-25 DIAGNOSIS — N5089 Other specified disorders of the male genital organs: Secondary | ICD-10-CM | POA: Diagnosis present

## 2021-06-25 IMAGING — US US SCROTUM W/ DOPPLER COMPLETE
1 series · 13 of 25 positions shown · non-contrast
Comparison: None.

CLINICAL DATA: Left-sided scrotal swelling for 6 months

EXAM:
SCROTAL ULTRASOUND
DOPPLER ULTRASOUND OF THE TESTICLES
TECHNIQUE: Complete ultrasound examination of the testicles, epididymis, and
other scrotal structures was performed. Color and spectral Doppler
ultrasound were also utilized to evaluate blood flow to the
testicles.

[Series 1: us scrotum w/doppler · 64 acquisitions, 13 frames shown]
[im 1/64]
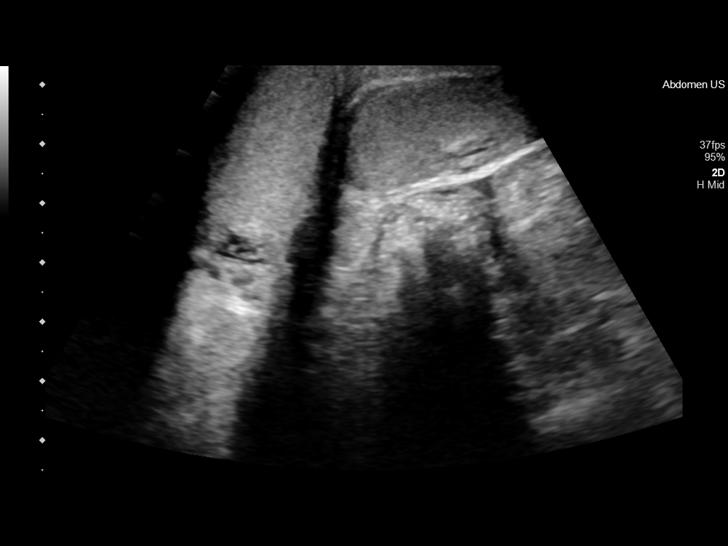
[im 6/64]
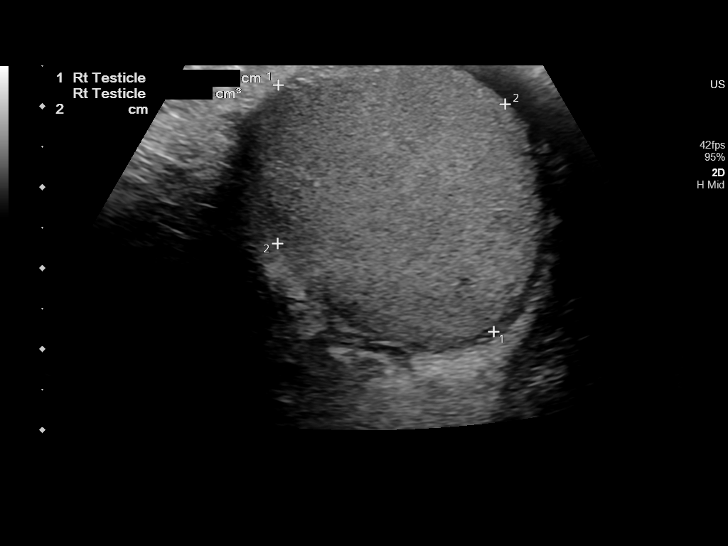
[im 11/64]
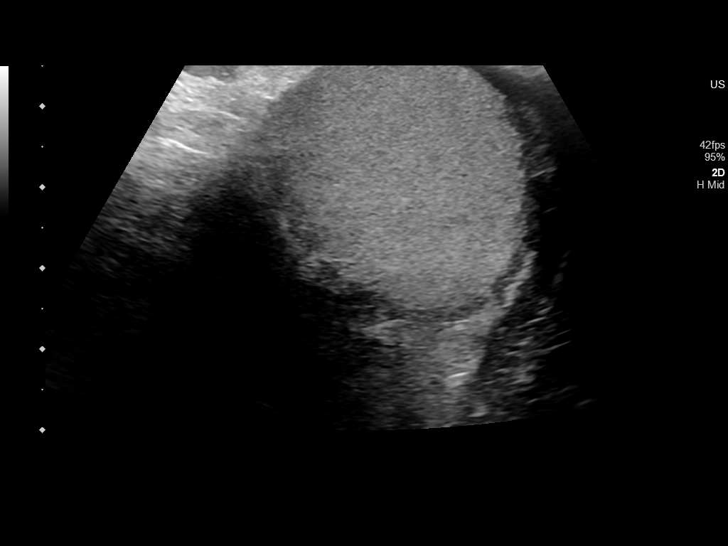
[im 16/64]
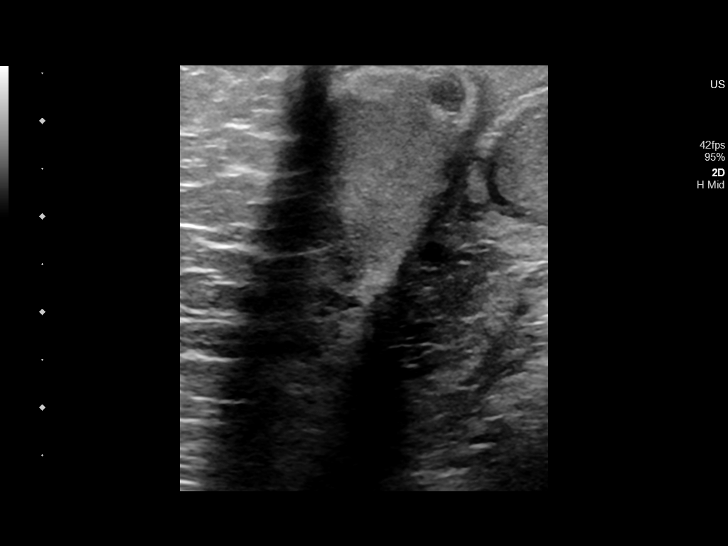
[im 22/64]
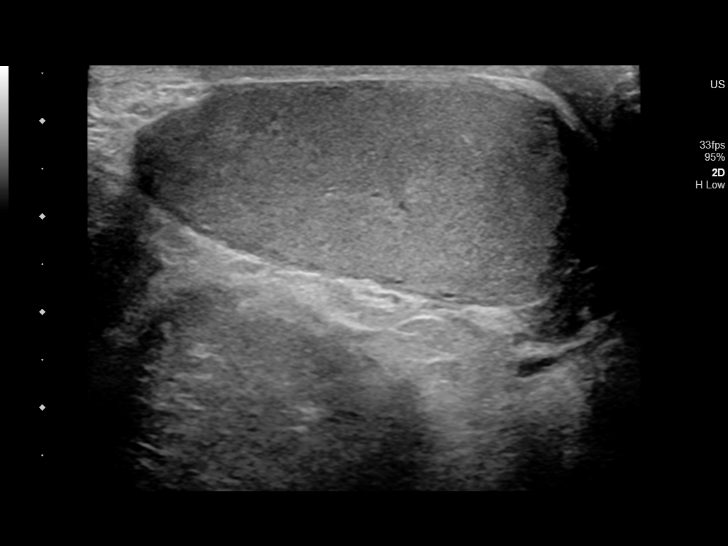
[im 27/64]
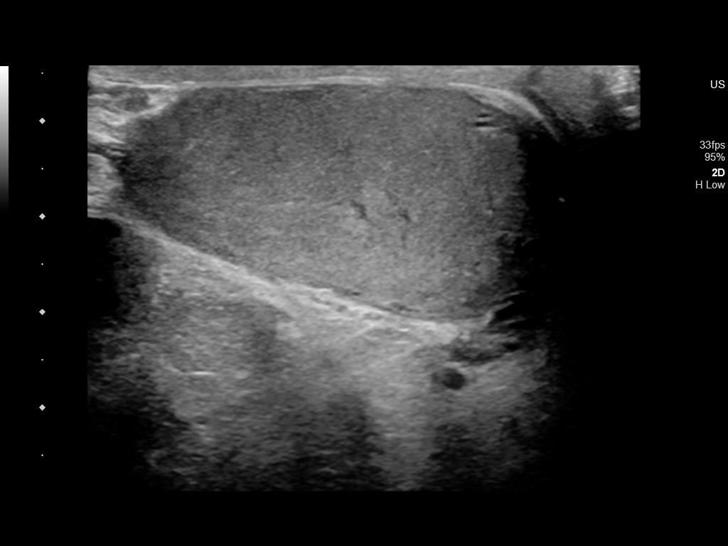
[im 32/64]
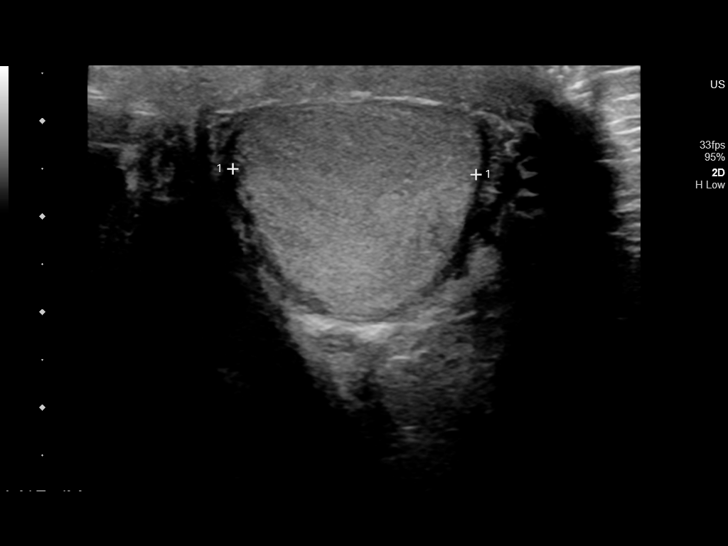
[im 37/64]
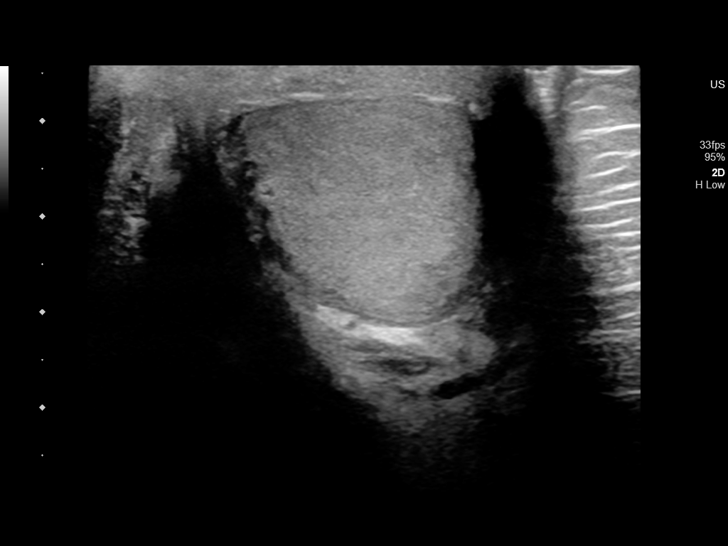
[im 43/64]
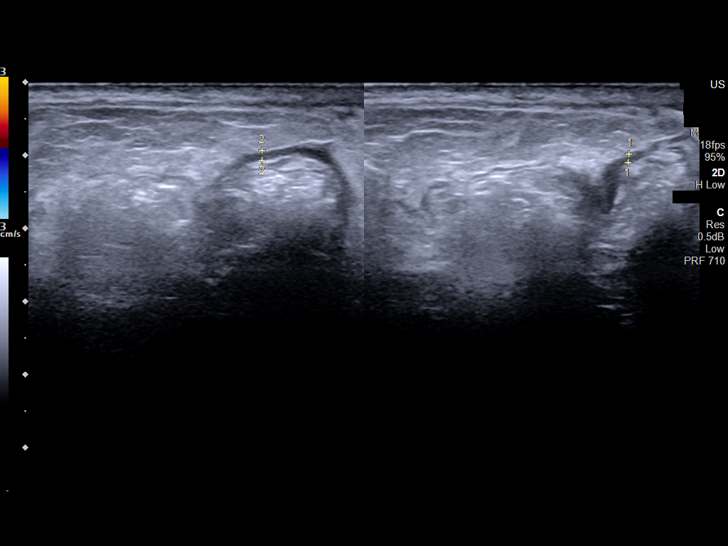
[im 48/64]
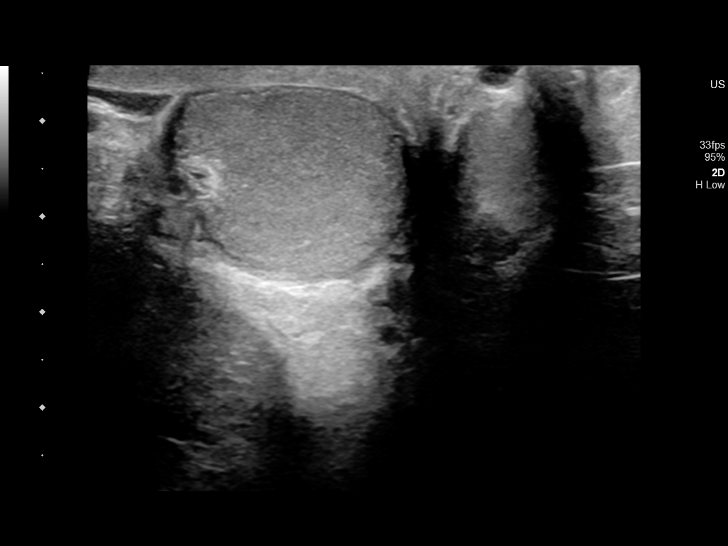
[im 53/64]
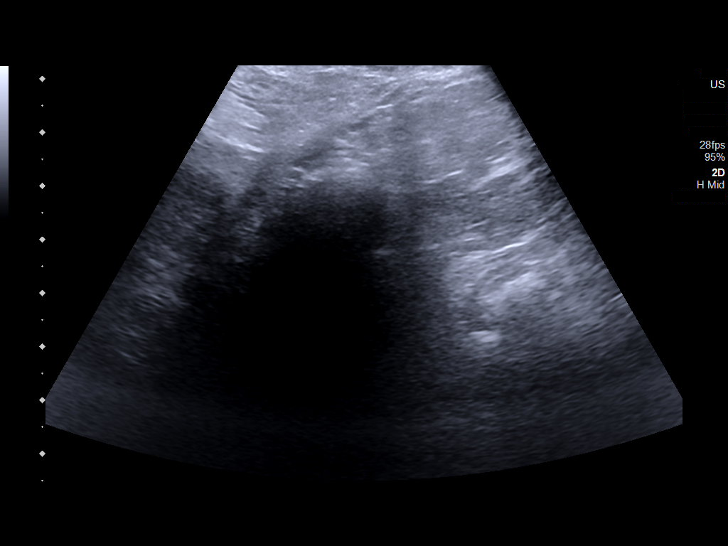
[im 58/64]
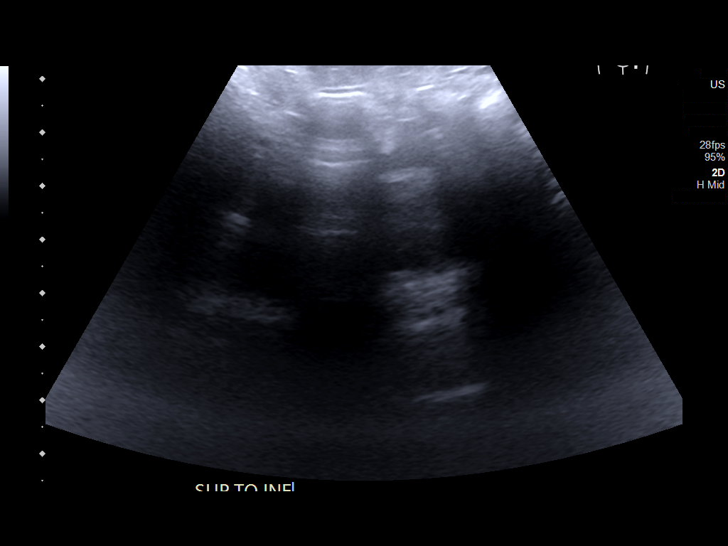
[im 64/64]
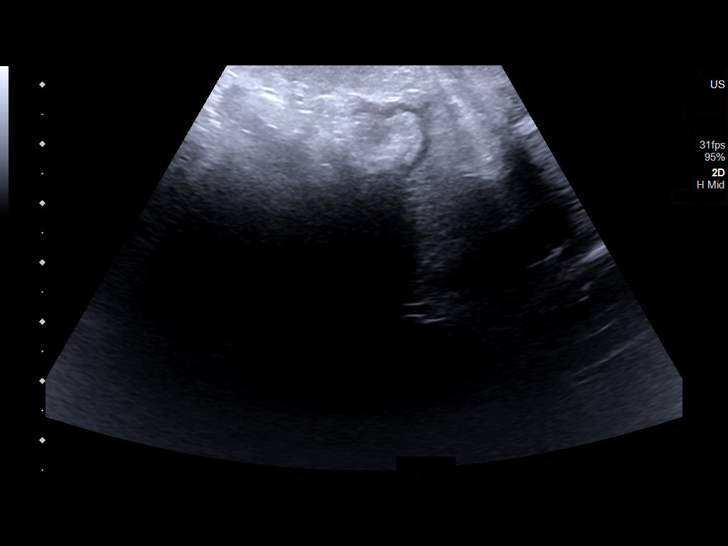

[13 of 25 positions shown; findings below may reference images not displayed]

FINDINGS: Right testicle

Measurements: 4.1 x 3.3 x 1.9 cm. No mass or microlithiasis
visualized.

Left testicle

Measurements: 4.9 x 2.2 x 2.6 cm. Small benign-appearing intra
testicular cyst measuring 0.5 x 0.3 x 0.6 cm. The remainder of the
left testicle is unremarkable.

Right epididymis:  Normal in size and appearance.

Left epididymis:  Normal in size and appearance.

Hydrocele:  None visualized.

Varicocele:  None visualized.

Pulsed Doppler interrogation of both testes demonstrates normal low
resistance arterial and venous waveforms bilaterally.

A left scrotal hernia is identified containing bowel, with
peristalsis observed on the cine imaging.
IMPRESSION: 1. Bowel containing left inguinal hernia extending into the left
hemiscrotum.
2. Small benign-appearing left intratesticular cyst. Otherwise
unremarkable appearance of the testes.

## 2021-06-27 ENCOUNTER — Telehealth: Payer: Self-pay | Admitting: *Deleted

## 2021-06-27 NOTE — Telephone Encounter (Signed)
Notified patient wife per DPR as instructed,

## 2021-06-27 NOTE — Telephone Encounter (Signed)
-----   Message from Riki Altes, MD sent at 06/26/2021  7:20 PM EST ----- Scrotal ultrasound showed a large hernia.  If he is asymptomatic no specific treatment is needed.  If wife desires further evaluation would recommend PCP refer to general surgery

## 2022-02-11 ENCOUNTER — Observation Stay
Admit: 2022-02-11 | Discharge: 2022-02-11 | Disposition: A | Discharge status: Home / Self Care | Payer: Medicare (Managed Care) | Attending: Internal Medicine | Admitting: Internal Medicine

## 2022-02-11 ENCOUNTER — Observation Stay: Payer: Medicare (Managed Care)

## 2022-02-11 ENCOUNTER — Emergency Department (HOSPITAL_COMMUNITY): Payer: Medicare (Managed Care)

## 2022-02-11 ENCOUNTER — Observation Stay
Admission: EM | Admit: 2022-02-11 | Discharge: 2022-02-12 | Disposition: A | Discharge status: Home / Self Care | Payer: Medicare (Managed Care) | Attending: Internal Medicine | Admitting: Internal Medicine

## 2022-02-11 ENCOUNTER — Other Ambulatory Visit: Payer: Self-pay

## 2022-02-11 ENCOUNTER — Emergency Department: Payer: Medicare (Managed Care)

## 2022-02-11 DIAGNOSIS — R2681 Unsteadiness on feet: Secondary | ICD-10-CM | POA: Diagnosis not present

## 2022-02-11 DIAGNOSIS — F0280 Dementia in other diseases classified elsewhere without behavioral disturbance: Secondary | ICD-10-CM | POA: Insufficient documentation

## 2022-02-11 DIAGNOSIS — R4781 Slurred speech: Secondary | ICD-10-CM | POA: Diagnosis not present

## 2022-02-11 DIAGNOSIS — I2510 Atherosclerotic heart disease of native coronary artery without angina pectoris: Secondary | ICD-10-CM | POA: Diagnosis present

## 2022-02-11 DIAGNOSIS — R296 Repeated falls: Secondary | ICD-10-CM | POA: Diagnosis not present

## 2022-02-11 DIAGNOSIS — I6502 Occlusion and stenosis of left vertebral artery: Secondary | ICD-10-CM | POA: Diagnosis not present

## 2022-02-11 DIAGNOSIS — R262 Difficulty in walking, not elsewhere classified: Secondary | ICD-10-CM | POA: Diagnosis not present

## 2022-02-11 DIAGNOSIS — G459 Transient cerebral ischemic attack, unspecified: Secondary | ICD-10-CM | POA: Diagnosis not present

## 2022-02-11 DIAGNOSIS — I5022 Chronic systolic (congestive) heart failure: Secondary | ICD-10-CM | POA: Diagnosis present

## 2022-02-11 DIAGNOSIS — Z79899 Other long term (current) drug therapy: Secondary | ICD-10-CM | POA: Insufficient documentation

## 2022-02-11 DIAGNOSIS — R531 Weakness: Secondary | ICD-10-CM | POA: Diagnosis present

## 2022-02-11 DIAGNOSIS — G309 Alzheimer's disease, unspecified: Secondary | ICD-10-CM | POA: Diagnosis not present

## 2022-02-11 DIAGNOSIS — Z20822 Contact with and (suspected) exposure to covid-19: Secondary | ICD-10-CM | POA: Diagnosis not present

## 2022-02-11 DIAGNOSIS — Z7902 Long term (current) use of antithrombotics/antiplatelets: Secondary | ICD-10-CM | POA: Insufficient documentation

## 2022-02-11 DIAGNOSIS — Z87891 Personal history of nicotine dependence: Secondary | ICD-10-CM | POA: Diagnosis not present

## 2022-02-11 DIAGNOSIS — F0390 Unspecified dementia without behavioral disturbance: Secondary | ICD-10-CM | POA: Diagnosis present

## 2022-02-11 LAB — CBC WITH DIFFERENTIAL/PLATELET
Abs Immature Granulocytes: 0.02 10*3/uL (ref 0.00–0.07)
Basophils Absolute: 0.1 10*3/uL (ref 0.0–0.1)
Basophils Relative: 1 %
Eosinophils Absolute: 0.5 10*3/uL (ref 0.0–0.5)
Eosinophils Relative: 9 %
HCT: 42.1 % (ref 39.0–52.0)
Hemoglobin: 14.1 g/dL (ref 13.0–17.0)
Immature Granulocytes: 0 %
Lymphocytes Relative: 19 %
Lymphs Abs: 1.1 10*3/uL (ref 0.7–4.0)
MCH: 29.4 pg (ref 26.0–34.0)
MCHC: 33.5 g/dL (ref 30.0–36.0)
MCV: 87.9 fL (ref 80.0–100.0)
Monocytes Absolute: 0.7 10*3/uL (ref 0.1–1.0)
Monocytes Relative: 12 %
Neutro Abs: 3.1 10*3/uL (ref 1.7–7.7)
Neutrophils Relative %: 59 %
Platelets: 202 10*3/uL (ref 150–400)
RBC: 4.79 MIL/uL (ref 4.22–5.81)
RDW: 13 % (ref 11.5–15.5)
WBC: 5.4 10*3/uL (ref 4.0–10.5)
nRBC: 0 % (ref 0.0–0.2)

## 2022-02-11 LAB — COMPREHENSIVE METABOLIC PANEL
ALT: 16 U/L (ref 0–44)
AST: 21 U/L (ref 15–41)
Albumin: 3.8 g/dL (ref 3.5–5.0)
Alkaline Phosphatase: 52 U/L (ref 38–126)
Anion gap: 7 (ref 5–15)
BUN: 6 mg/dL — ABNORMAL LOW (ref 8–23)
CO2: 25 mmol/L (ref 22–32)
Calcium: 9 mg/dL (ref 8.9–10.3)
Chloride: 103 mmol/L (ref 98–111)
Creatinine, Ser: 0.92 mg/dL (ref 0.61–1.24)
GFR, Estimated: >60 mL/min (ref >60)
Glucose, Bld: 107 mg/dL — ABNORMAL HIGH (ref 70–99)
Potassium: 4.4 mmol/L (ref 3.5–5.1)
Sodium: 135 mmol/L (ref 135–145)
Total Bilirubin: 1 mg/dL (ref 0.3–1.2)
Total Protein: 6.9 g/dL (ref 6.5–8.1)

## 2022-02-11 LAB — URINALYSIS, ROUTINE W REFLEX MICROSCOPIC
Bilirubin Urine: NEGATIVE
Glucose, UA: NEGATIVE mg/dL
Hgb urine dipstick: NEGATIVE
Ketones, ur: NEGATIVE mg/dL
Leukocytes,Ua: NEGATIVE
Nitrite: NEGATIVE
Protein, ur: NEGATIVE mg/dL
Specific Gravity, Urine: 1.004 — ABNORMAL LOW (ref 1.005–1.030)
pH: 8 (ref 5.0–8.0)

## 2022-02-11 LAB — HEMOGLOBIN A1C
Hgb A1c MFr Bld: 5.5 % (ref 4.8–5.6)
Mean Plasma Glucose: 111.15 mg/dL

## 2022-02-11 LAB — TROPONIN I (HIGH SENSITIVITY)
Troponin I (High Sensitivity): 19 ng/L — ABNORMAL HIGH (ref <18)
Troponin I (High Sensitivity): 20 ng/L — ABNORMAL HIGH (ref <18)

## 2022-02-11 LAB — LACTIC ACID, PLASMA: Lactic Acid, Venous: 1.4 mmol/L (ref 0.5–1.9)

## 2022-02-11 LAB — SARS CORONAVIRUS 2 BY RT PCR: SARS Coronavirus 2 by RT PCR: NEGATIVE

## 2022-02-11 MED ORDER — SPIRONOLACTONE 25 MG PO TABS: 25.0000 mg | ORAL_TABLET | Freq: Every day | ORAL | Status: DC

## 2022-02-11 MED ORDER — ADULT MULTIVITAMIN W/MINERALS CH
1.0000 | ORAL_TABLET | Freq: Every day | ORAL | Status: DC
  Administered 2022-02-11 – 2022-02-12 (×2): 1 via ORAL
  Filled 2022-02-11 (×2): qty 1

## 2022-02-11 MED ORDER — CLOPIDOGREL BISULFATE 75 MG PO TABS
75.0000 mg | ORAL_TABLET | Freq: Every day | ORAL | Status: DC
  Administered 2022-02-11 – 2022-02-12 (×2): 75 mg via ORAL
  Filled 2022-02-11 (×2): qty 1

## 2022-02-11 MED ORDER — METOPROLOL SUCCINATE ER 50 MG PO TB24: 25.0000 mg | ORAL_TABLET | Freq: Every day | ORAL | Status: DC

## 2022-02-11 MED ORDER — LORAZEPAM 2 MG/ML IJ SOLN
0.5000 mg | Freq: Once | INTRAMUSCULAR | Status: AC | PRN
  Administered 2022-02-11: 0.5 mg via INTRAVENOUS
  Filled 2022-02-11: qty 1

## 2022-02-11 MED ORDER — MELATONIN 5 MG PO TABS
5.0000 mg | ORAL_TABLET | Freq: Every day | ORAL | Status: DC
  Administered 2022-02-11: 5 mg via ORAL
  Filled 2022-02-11: qty 1

## 2022-02-11 MED ORDER — MEMANTINE HCL 5 MG PO TABS
5.0000 mg | ORAL_TABLET | Freq: Two times a day (BID) | ORAL | Status: DC
  Administered 2022-02-11 – 2022-02-12 (×3): 5 mg via ORAL
  Filled 2022-02-11 (×3): qty 1

## 2022-02-11 MED ORDER — METOPROLOL SUCCINATE ER 25 MG PO TB24
12.5000 mg | ORAL_TABLET | Freq: Every day | ORAL | Status: DC
  Administered 2022-02-12: 12.5 mg via ORAL
  Filled 2022-02-11: qty 1

## 2022-02-11 MED ORDER — ATORVASTATIN CALCIUM 20 MG PO TABS
80.0000 mg | ORAL_TABLET | Freq: Every day | ORAL | Status: DC
  Administered 2022-02-11 – 2022-02-12 (×2): 80 mg via ORAL
  Filled 2022-02-11 (×2): qty 4

## 2022-02-11 MED ORDER — ACETAMINOPHEN 160 MG/5ML PO SOLN: 650.0000 mg | ORAL | Status: DC | PRN

## 2022-02-11 MED ORDER — ACETAMINOPHEN 650 MG RE SUPP: 650.0000 mg | RECTAL | Status: DC | PRN

## 2022-02-11 MED ORDER — PERFLUTREN LIPID MICROSPHERE
1.0000 mL | INTRAVENOUS | Status: AC | PRN
  Administered 2022-02-11: 5 mL via INTRAVENOUS

## 2022-02-11 MED ORDER — MIRTAZAPINE 15 MG PO TABS
7.5000 mg | ORAL_TABLET | Freq: Every day | ORAL | Status: DC
  Administered 2022-02-11: 7.5 mg via ORAL
  Filled 2022-02-11: qty 1

## 2022-02-11 MED ORDER — LISINOPRIL 5 MG PO TABS
5.0000 mg | ORAL_TABLET | Freq: Every day | ORAL | Status: DC
  Administered 2022-02-12: 5 mg via ORAL
  Filled 2022-02-11: qty 1

## 2022-02-11 MED ORDER — ACETAMINOPHEN 325 MG PO TABS
650.0000 mg | ORAL_TABLET | ORAL | Status: DC | PRN
  Administered 2022-02-12: 650 mg via ORAL
  Filled 2022-02-11: qty 2

## 2022-02-11 MED ORDER — SENNA 8.6 MG PO TABS
2.0000 | ORAL_TABLET | Freq: Every day | ORAL | Status: DC
  Administered 2022-02-11: 17.2 mg via ORAL
  Filled 2022-02-11 (×2): qty 2

## 2022-02-11 MED ORDER — TRAZODONE HCL 50 MG PO TABS: 25.0000 mg | ORAL_TABLET | Freq: Every evening | ORAL | Status: DC | PRN

## 2022-02-11 MED ORDER — STROKE: EARLY STAGES OF RECOVERY BOOK: Freq: Once | Status: AC

## 2022-02-11 MED ORDER — TAMSULOSIN HCL 0.4 MG PO CAPS
0.4000 mg | ORAL_CAPSULE | Freq: Every day | ORAL | Status: DC
  Administered 2022-02-11 – 2022-02-12 (×2): 0.4 mg via ORAL
  Filled 2022-02-11 (×2): qty 1

## 2022-02-11 NOTE — ED Triage Notes (Signed)
Pt BIB [facility] with reports of weakness that started this morning. Pt with hx of dementia. Pt is at his baseline mentation per EMS.

## 2022-02-11 NOTE — Assessment & Plan Note (Signed)
History of coronary artery disease with ischemic cardiomyopathy Continue metoprolol, spironolactone and lisinopril.  Plavix and atorvastatin

## 2022-02-11 NOTE — Evaluation (Signed)
Occupational Therapy Evaluation Patient Details Name: Christopher Cortez MRN: [id number] DOB: 1941-06-22 Today's Date: 02/11/2022   History of Present Illness Pt. is an 81 y.o. male who was admitted to [facility] with right sided facial droop, and slurred speech. Pt. PMHx includes: CAD, CHF, Alzheimer's Dementia.   Clinical Impression   Pt. Presents with weakness, history of cognitive impairment, and limited functional mobility which limits his ability to complete basic ADL and IADL functioning. Pt. Resides at home with his wife. Pt. Required assist with ADL, and IADL tasks from his wife including morning care, home management, meal preparation, medication management,  and transportation. Pt. [facility]ends [facility] 3x's a week. Pt. Requires heavy cognitive cues for safety for proper walker use when walking from the bed to the bathroom.  Pt.'s BP dropped to 93/76 when moving from supine to sitting at the EOB. BP was WNL when transitioning from sitting to standing. Pt.'s wife reports a history of orthostatic hypotension, and falls. Pt. requires heavy cognitive cues for task initiation, and redirection during ADL tasks. Pt. Will benefit from OT services for ADL training, A/E training, and pt. education about cognitive compensatory strategies during ADLS, and IADLs. Pt. Plans to return home upon discharge with family to assist pt. as needed. Pt. Could benefit from follow-up HHOT services upon discharge.      Recommendations for follow up therapy are one component of a multi-disciplinary discharge planning process, led by the attending physician.  Recommendations may be updated based on patient status, additional functional criteria and insurance authorization.   Follow Up Recommendations  Home health OT    Assistance Recommended at Discharge    Patient can return home with the following A lot of help with bathing/dressing/bathroom;Assistance with cooking/housework;Assist for transportation;Direct  supervision/assist for financial management    Functional Status Assessment  Patient has had a recent decline in their functional status and demonstrates the ability to make significant improvements in function in a reasonable and predictable amount of time.  Equipment Recommendations       Recommendations for Other Services       Precautions / Restrictions Precautions Precautions: Fall Restrictions Weight Bearing Restrictions: No      Mobility Bed Mobility               General bed mobility comments: MinA with cues for initiation    Transfers Overall transfer level: Needs assistance   Transfers: Sit to/from Stand Sit to Stand: Min assist           General transfer comment: Pt. required minA walking from the bed to the bathroom to navigate, and negotiate the walker through the room, and in the bathroom. Heavy cues required for safety.      Balance                                           ADL either performed or assessed with clinical judgement   ADL Overall ADL's : Needs assistance/impaired             Lower Body Bathing: Moderate assistance   Upper Body Dressing : Moderate assistance       Toilet Transfer: Minimal assistance             General ADL Comments: Increased cognitive cuing required     Vision Patient Visual Report: No change from baseline  Perception     Praxis      Pertinent Vitals/Pain Pain Assessment Pain Assessment: No/denies pain     Hand Dominance     Extremity/Trunk Assessment Upper Extremity Assessment Upper Extremity Assessment: Generalized weakness           Communication Communication Communication: No difficulties   Cognition Arousal/Alertness: Awake/alert Behavior During Therapy: Restless Overall Cognitive Status: History of cognitive impairments - at baseline                                       General Comments       Exercises     Shoulder  Instructions      Home Living Family/patient expects to be discharged to:: Private residence Living Arrangements: Spouse/significant other Available Help at Discharge: Family Type of Home: House       Home Layout: One level     Bathroom Shower/Tub: Tub/shower unit;Curtain         Home Equipment: Educational psychologist (4 wheels);Cane - single point          Prior Functioning/Environment Prior Level of Function : Needs assist             Mobility Comments: Pt. wife reports multiple falls, and history of orthostatic Hypotension when standing ADLs Comments: Wife assists with all ADLs, and IADL tasks at home.Pt. asttends the [facility] program 3x's a week.        OT Problem List: Decreased strength      OT Treatment/Interventions:      OT Goals(Current goals can be found in the care plan section) Acute Rehab OT Goals OT Goal Formulation: Patient unable to participate in goal setting Time For Goal Achievement: 02/16/22 Potential to Achieve Goals: Good  OT Frequency:      Co-evaluation              AM-PAC OT "6 Clicks" Daily Activity     Outcome Measure Help from another person eating meals?: A Little Help from another person taking care of personal grooming?: A Little Help from another person toileting, which includes using toliet, bedpan, or urinal?: A Little Help from another person bathing (including washing, rinsing, drying)?: A Lot Help from another person to put on and taking off regular upper body clothing?: A Little Help from another person to put on and taking off regular lower body clothing?: A Lot 6 Click Score: 16   End of Session Equipment Utilized During Treatment: Gait belt  Activity Tolerance: Patient tolerated treatment well Patient left: in bed (Sitting at the EOB with his wife, nursing, and PT.)  OT Visit Diagnosis: Unsteadiness on feet (R26.81);History of falling (Z91.81)                Time: 1340-1415 OT Time Calculation (min): 35  min Charges:  OT General Charges $OT Visit: 1 Visit OT Evaluation $OT Eval Moderate Complexity: 1 Mod Olegario Messier, MS, OTR/L   Olegario Messier 02/11/2022, 2:45 PM

## 2022-02-11 NOTE — Progress Notes (Signed)
Admission profile updated. ?

## 2022-02-11 NOTE — Evaluation (Signed)
Physical Therapy Evaluation Patient Details Name: Christopher Cortez MRN: [id number] DOB: 1940-09-03 Today's Date: 02/11/2022  History of Present Illness  Pt. is an 81 y.o. male who was admitted to [facility] with right sided facial droop, and slurred speech. Pt. PMHx includes: CAD, CHF, Alzheimer's Dementia.   Clinical Impression  Patient alert, family in room reported he seemed to have returned to baseline cognitive status and that his speech has continued to improve throughout today. At baseline the patient attends the [facility] program 3x a week, and needs either supervision for ADLs for safety, or wife performs IADLs.  Pt noted to be restless throughout session, did become agitated when PT did not want to leave the patient alone in  the bathroom. He did ambulate ~20ft total without AD, no LOB, supervision. Many transfers on standard commode, including pt impulsively closing the door behind the PT. Returned to supine where pt demonstrated he was able to lift both extremities against gravity. The patient demonstrated and family reported near return to baseline level of functioning, no further acute PT needs indicated. PT to sign off. Please reconsult PT if pt status changes or acute needs are identified. Recommendation is for the patient to continue to participate in the [facility] program.         Recommendations for follow up therapy are one component of a multi-disciplinary discharge planning process, led by the attending physician.  Recommendations may be updated based on patient status, additional functional criteria and insurance authorization.  Follow Up Recommendations Other (comment) (continue PACE program)      Assistance Recommended at Discharge Frequent or constant Supervision/Assistance  Patient can return home with the following  A little help with walking and/or transfers;A little help with bathing/dressing/bathroom;Assistance with cooking/housework;Direct supervision/assist for financial  management;Assist for transportation;Direct supervision/assist for medications management;Help with stairs or ramp for entrance    Equipment Recommendations None recommended by PT  Recommendations for Other Services       Functional Status Assessment Patient has not had a recent decline in their functional status     Precautions / Restrictions Precautions Precautions: Fall Restrictions Weight Bearing Restrictions: No      Mobility  Bed Mobility Overal bed mobility: Needs Assistance Bed Mobility: Supine to Sit     Supine to sit: Supervision     General bed mobility comments: supervision for cues, safety    Transfers Overall transfer level: Needs assistance   Transfers: Sit to/from Stand Sit to Stand: Supervision                Ambulation/Gait Ambulation/Gait assistance: Supervision Gait Distance (Feet): 20 Feet Assistive device: Rolling walker (2 wheels)         General Gait Details: ambulated without RW, impulsive, no true LOB noted but with restlessness throughout mobility  Stairs            Wheelchair Mobility    Modified Rankin (Stroke Patients Only)       Balance Overall balance assessment: Needs assistance Sitting-balance support: Feet supported Sitting balance-Leahy Scale: Good       Standing balance-Leahy Scale: Fair Standing balance comment: occasionally reaches out for external support                             Pertinent Vitals/Pain Pain Assessment Pain Assessment: Faces Faces Pain Scale: No hurt    Home Living Family/patient expects to be discharged to:: Private residence Living Arrangements: Spouse/significant other Available Help at Discharge: Family  Type of Home: House         Home Layout: One level Home Equipment: Educational psychologist (4 wheels);Cane - single point      Prior Function Prior Level of Function : Needs assist             Mobility Comments: Pt. wife reports multiple falls,  and history of orthostatic Hypotension when standing. stated he uses the rollator for going out to [facility], but at home does not use one ADLs Comments: Wife assists with all ADLs, and IADL tasks at home.Pt. asttends the [facility] program 3x's a week.     Hand Dominance        Extremity/Trunk Assessment   Upper Extremity Assessment Upper Extremity Assessment: Overall WFL for tasks assessed;Defer to OT evaluation    Lower Extremity Assessment Lower Extremity Assessment: Overall WFL for tasks assessed (true MMT deferred due to pt mental state, but able to move both legs against gravity)    Cervical / Trunk Assessment Cervical / Trunk Assessment: Normal  Communication   Communication: No difficulties  Cognition Arousal/Alertness: Awake/alert Behavior During Therapy: Restless, Agitated, Impulsive Overall Cognitive Status: History of cognitive impairments - at baseline                                 General Comments: family reports pt appears to be at his cognitive baseline        General Comments      Exercises Other Exercises Other Exercises: pt adamant about being alone in the bathroom. supervision to sit on commode, but did get up and close the door this author had left cracked. family reported that this is normal behavior at home as well   Assessment/Plan    PT Assessment Patient does not need any further PT services  PT Problem List         PT Treatment Interventions      PT Goals (Current goals can be found in the Care Plan section)       Frequency       Co-evaluation               AM-PAC PT "6 Clicks" Mobility  Outcome Measure Help needed turning from your back to your side while in a flat bed without using bedrails?: None Help needed moving from lying on your back to sitting on the side of a flat bed without using bedrails?: None Help needed moving to and from a bed to a chair (including a wheelchair)?: None Help needed standing up from a  chair using your arms (e.g., wheelchair or bedside chair)?: None Help needed to walk in hospital room?: A Little Help needed climbing 3-5 steps with a railing? : A Little 6 Click Score: 22    End of Session   Activity Tolerance: Patient tolerated treatment well;Treatment limited secondary to agitation Patient left: in bed;with call bell/phone within reach;with bed alarm set;with family/visitor present Nurse Communication: Mobility status PT Visit Diagnosis: Other abnormalities of gait and mobility (R26.89)    Time: 2585-2778 PT Time Calculation (min) (ACUTE ONLY): 20 min   Charges:   PT Evaluation $PT Eval Low Complexity: 1 Low PT Treatments $Therapeutic Activity: 8-22 mins       Olga Coaster PT, DPT 3:36 PM,02/11/22

## 2022-02-11 NOTE — Assessment & Plan Note (Addendum)
Patient presents for evaluation of right facial droop and slurred speech which has resolved Had weakness and unsteady gait 24 hours prior and so was not called in as a code stroke. Initial CT scan of the head without contrast does not show evidence of a bleed Obtain MRI of the brain without contrast Obtain 2D echocardiogram to assess LVEF and rule out cardiac thrombus Allow for permissive hypertension until acute stroke is ruled out Will consult PT/OT/ST consult Consult neurology if MRI shows an acute stroke

## 2022-02-11 NOTE — Assessment & Plan Note (Signed)
Stable Continue mirtazapine, trazodone and Namenda

## 2022-02-11 NOTE — H&P (Addendum)
History and Physical    Patient: Christopher Cortez BTY:[id number] DOB: Jan 06, 1941 DOA: 02/11/2022 DOS: the patient was seen and examined on 02/11/2022 PCP: [facility]  Patient coming from: Home  Chief Complaint:  Chief Complaint  Patient presents with   Weakness   HPI: Christopher Cortez is a 81 y.o. male with medical history significant for chronic systolic heart failure, Alzheimer's dementia, coronary artery disease who presents to the ER via EMS for evaluation of slurred speech and right facial droop which the wife noted on the day of admission.  She states the patient woke up at 7 AM with the above symptoms.  At the time of my evaluation symptoms have resolved and patient is back to baseline and is able to answer questions but is confused and at his baseline. She notes that 1 day prior patient had weakness and an unsteady gait but did not fall.  According to his wife this is unusual for him. I am unable to do review of symptoms in this patient due to his underlying dementia.     Review of Systems: unable to review all systems due to the inability of the patient to answer questions. No past medical history on file. No past surgical history on file. Social History:  reports that he has quit smoking. His smoking use included cigarettes. He has never used smokeless tobacco. No history on file for alcohol use and drug use.  No Known Allergies  No family history on file.  Prior to Admission medications   Medication Sig Start Date End Date Taking? Authorizing Provider  atorvastatin (LIPITOR) 80 MG tablet Take 80 mg by mouth daily. 05/08/21   [provider]  clopidogrel (PLAVIX) 75 MG tablet Take 75 mg by mouth daily. 05/08/21   [provider]  lisinopril (ZESTRIL) 5 MG tablet Take 5 mg by mouth daily. 03/19/21   [provider]  Melatonin 3 MG CAPS Take by mouth.    [provider]  memantine (NAMENDA) 5 MG tablet Take 5 mg by mouth 2 (two)  times daily.    [provider]  metoprolol succinate (TOPROL-XL) 25 MG 24 hr tablet Take 25 mg by mouth daily. 05/08/21   [provider]  mirtazapine (REMERON) 7.5 MG tablet Take 7.5 mg by mouth at bedtime. 05/08/21   [provider]  Multiple Vitamins-Minerals (MULTIVITAMIN WITH MINERALS) tablet Take 1 tablet by mouth daily.    [provider]  senna (SENOKOT) 8.6 MG TABS tablet Take 2 tablets by mouth daily. 05/08/21   [provider]  spironolactone (ALDACTONE) 25 MG tablet Take by mouth. 02/19/21 02/19/22  [provider]  tamsulosin (FLOMAX) 0.4 MG CAPS capsule Take by mouth. 02/13/21   [provider]  traZODone (DESYREL) 50 MG tablet Take 25 mg by mouth at bedtime as needed. 05/08/21   [provider]  VENTOLIN HFA 108 (90 Base) MCG/ACT inhaler SMARTSIG:2 Inhalation Via Inhaler Every 6 Hours PRN 01/09/21   [provider]    Physical Exam: Vitals:   02/11/22 0925 02/11/22 1000 02/11/22 1100 02/11/22 1242  BP:  (!) 146/81 (!) 145/84 (!) 147/87  Pulse:  71 70 69  Resp:  13 12 20   Temp: 98 F (36.7 C)   98 F (36.7 C)  TempSrc: Oral   Oral  SpO2:  97% 96% 98%   Physical Exam Vitals and nursing note reviewed.  Constitutional:      Appearance: He is obese.     Comments: Oriented  to person  HENT:     Head: Normocephalic and atraumatic.     Nose: Nose normal.     Mouth/Throat:     Mouth: Mucous membranes are moist.  Eyes:     Pupils: Pupils are equal, round, and reactive to light.  Cardiovascular:     Rate and Rhythm: Normal rate and regular rhythm.  Pulmonary:     Effort: Pulmonary effort is normal.     Breath sounds: Normal breath sounds.  Abdominal:     General: Abdomen is flat. Bowel sounds are normal.     Palpations: Abdomen is soft.  Musculoskeletal:        General: Normal range of motion.     Cervical back: Normal range of motion and neck supple.     Comments: Able to move all his extremities   Neurological:     Mental Status: He is alert.     Comments: Able to move all his extremities  Psychiatric:        Mood and Affect: Mood normal.        Behavior: Behavior normal.     Data Reviewed: Relevant notes from primary care and specialist visits, past discharge summaries as available in EHR, including Care Everywhere. Prior diagnostic testing as pertinent to current admission diagnoses Updated medications and problem lists for reconciliation ED course, including vitals, labs, imaging, treatment and response to treatment Triage notes, nursing and pharmacy notes and ED provider's notes Notable results as noted in HPI Labs reviewed.  Troponin 19 > > 20, sodium 135, potassium 4.4, chloride 103, bicarb 25, glucose 107, BUN 6, creatinine 0.92, calcium 9.0, total protein 6.9, albumin 3.8, AST 21, ALT 16, alkaline phosphatase 52, total bilirubin 1.0, lactic acid 1.4, white count 5.4, hemoglobin 14.1, hematocrit 42.1, platelet count 202 CT scan of the head without contrast showed No acute intracranial abnormality.Unchanged mild sequela of chronic small vessel ischemic disease and cerebral atrophy. Chest x-ray reviewed by me shows cardiomegaly with no pulmonary infiltrate Twelve-lead EKG reviewed by me shows sinus rhythm, prolonged PR interval, right bundle branch block There are no new results to review at this time.  Assessment and Plan: * TIA (transient ischemic attack) Patient presents for evaluation of right facial droop and slurred speech which has resolved Had weakness and unsteady gait 24 hours prior and so was not called in as a code stroke. Initial CT scan of the head without contrast does not show evidence of a bleed Obtain MRI of the brain without contrast Obtain 2D echocardiogram to assess LVEF and rule out cardiac thrombus Allow for permissive hypertension until acute stroke is ruled out Will consult PT/OT/ST consult Consult neurology if MRI shows an acute stroke  CAD  (coronary artery disease) History of coronary artery disease with ischemic cardiomyopathy Continue metoprolol, Plavix and atorvastatin  Dementia (HCC) Stable Continue mirtazapine, trazodone and Namenda  Chronic systolic CHF (congestive heart failure) (HCC) Last known LVEF of 30% from 2D echocardiogram which was done 05/22 Not acutely exacerbated Continue metoprolol and lisinopril      Advance Care Planning:   Code Status: Full Code   Consults: PT/OT/ST  Family Communication: Greater than 50% of time was spent discussing patient's condition and plan of care with his wife at the bedside.  All questions and concerns have been addressed.  She verbalizes understanding and agrees with the plan.  CODE STATUS was discussed and patient is a full code  Severity of Illness: The appropriate patient status for this patient is OBSERVATION.  Observation status is judged to be reasonable and necessary in order to provide the required intensity of service to ensure the patient's safety. The patient's presenting symptoms, physical exam findings, and initial radiographic and laboratory data in the context of their medical condition is felt to place them at decreased risk for further clinical deterioration. Furthermore, it is anticipated that the patient will be medically stable for discharge from the hospital within 2 midnights of admission.   Author: Lucile Shutters, MD 02/11/2022 1:42 PM  For on call review [email or web address].

## 2022-02-11 NOTE — Progress Notes (Signed)
Pt is alert but confused. Pt keep taking his tele off due to confusion. MD made aware

## 2022-02-11 NOTE — ED Provider Notes (Signed)
[facility] Provider Note    Event Date/Time   First MD Initiated Contact with Patient 02/11/22 [phone]     (approximate)   History   Weakness  Level V Caveat:  Dementia  HPI  Christopher Cortez is a 81 y.o. male   with a history of dementia presents to the ER from home after wife called out and the patient has been weaker than normal for the past 12 to 24 hours.  This morning was having difficulty standing without significant assistance which is new for him.  Patient denies any pain or discomfort.  No complaints.      Physical Exam   Triage Vital Signs: ED Triage Vitals  Enc Vitals Group     BP      Pulse      Resp      Temp      Temp src      SpO2      Weight      Height      Head Circumference      Peak Flow      Pain Score      Pain Loc      Pain Edu?      Excl. in GC?     Most recent vital signs: Vitals:   02/11/22 0925 02/11/22 1000  BP:  (!) 146/81  Pulse:  71  Resp:  13  Temp: 98 F (36.7 C)   SpO2:  97%     Constitutional: Alert disoriented x 2 Eyes: Conjunctivae are normal.  Head: Atraumatic. Nose: No congestion/rhinnorhea. Mouth/Throat: Mucous membranes are moist.   Neck: Painless ROM.  Cardiovascular:   Good peripheral circulation. Respiratory: Normal respiratory effort.  No retractions.  Gastrointestinal: Soft and nontender.  Musculoskeletal:  no deformity Neurologic:  MAE spontaneously. No gross focal neurologic deficits are appreciated.  Skin:  Skin is warm, dry and intact. No rash noted. Psychiatric: Mood and affect are normal. Speech and behavior are normal.    ED Results / Procedures / Treatments   Labs (all labs ordered are listed, but only abnormal results are displayed) Labs Reviewed  COMPREHENSIVE METABOLIC PANEL - Abnormal; Notable for the following components:      Result Value   Glucose, Bld 107 (*)    BUN 6 (*)    All other components within normal limits  URINALYSIS, ROUTINE W REFLEX  MICROSCOPIC - Abnormal; Notable for the following components:   Color, Urine YELLOW (*)    APPearance CLEAR (*)    Specific Gravity, Urine 1.004 (*)    All other components within normal limits  TROPONIN I (HIGH SENSITIVITY) - Abnormal; Notable for the following components:   Troponin I (High Sensitivity) 19 (*)    All other components within normal limits  TROPONIN I (HIGH SENSITIVITY) - Abnormal; Notable for the following components:   Troponin I (High Sensitivity) 20 (*)    All other components within normal limits  SARS CORONAVIRUS 2 BY RT PCR  CBC WITH DIFFERENTIAL/PLATELET  LACTIC ACID, PLASMA     EKG  ED ECG REPORT I, Willy Eddy, the attending physician, personally viewed and interpreted this ECG.   Date: 02/11/2022  EKG Time: 7:40  Rate: 70  Rhythm: sinus  Axis: normal  Intervals:rbbb  ST&T Change: no stemi, nonspecifc t wave abn    RADIOLOGY Please see ED Course for my review and interpretation.  I personally reviewed all radiographic images ordered to evaluate for the above acute complaints and  reviewed radiology reports and findings.  These findings were personally discussed with the patient.  Please see medical record for radiology report.    PROCEDURES  Procedures   MEDICATIONS ORDERED IN ED: Medications - No data to display   IMPRESSION / MDM / ASSESSMENT AND PLAN / ED COURSE  I reviewed the triage vital signs and the nursing notes.                              Differential diagnosis includes, but is not limited to, Dehydration, sepsis, pna, uti, hypoglycemia, cva, drug effect, withdrawal, encephalitis  Patient presented to the ER for evaluation of symptoms as described above.  This presenting complaint could reflect a potentially life-threatening illness therefore the patient will be placed on continuous pulse oximetry and telemetry for monitoring.  Laboratory evaluation will be sent to evaluate for the above complaints.    Clinical Course  as of 02/11/22 1057  Mon Feb 11, 2022  5035 CT head on my review and interpretation does not show any evidence of bleed.  Will await formal radiology report. [PR]  [phone] Wife not bedside.  States that this morning he had a apparent right facial droop lasting less than an hour.  Is now back at his baseline.  He is on Plavix.  Presentation but more concerning for possible TIA.  Has been also having cough.  She is concerned for COVID [PR]  1048 Of negative.  Given his age and risk factors and symptoms concerning for TIA will consult with hospitalist for admission. [PR]  1056 Case discussed in consultation with the hospitalist who agrees to admit patient for further work-up and management. [PR]    Clinical Course User Index [PR] Willy Eddy, MD        FINAL CLINICAL IMPRESSION(S) / ED DIAGNOSES   Final diagnoses:  TIA (transient ischemic attack)     Rx / DC Orders   ED Discharge Orders     None        Note:  This document was prepared using Dragon voice recognition software and may include unintentional dictation errors.    Willy Eddy, MD 02/11/22 1057

## 2022-02-11 NOTE — Progress Notes (Signed)
  Echocardiogram 2D Echocardiogram has been performed.  Augustine Radar 02/11/2022, 1:55 PM

## 2022-02-11 NOTE — Progress Notes (Signed)
       CROSS COVER NOTE  NAME: Christopher Cortez MRN: [id number] DOB : [birth year]    Date of Service   02/11/22  HPI/Events of Note   Medication request received to address claustrophobia while in MRI  Interventions   Plan: Ativan x1     This document was prepared using Dragon voice recognition software and may include unintentional dictation errors.  Bishop Limbo DNP, MHA, FNP-BC Nurse Practitioner [facility] Pager [phone]

## 2022-02-11 NOTE — Progress Notes (Signed)
SLP Cancellation Note  Patient Details Name: Christopher Cortez MRN: [id number] DOB: 11/17/1940   Cancelled treatment:       Reason Eval/Treat Not Completed: Medical issues and pending test results which prohibited therapy   SLP consult received and appreciated. Chart review completed. Will defer cognitive-linguistic evaluation pending results of STAT MRI. Pt appears to have resolution of speech s/sx, including facial droop, and has hx of dementia per chart review.  Clyde Canterbury, M.S., CCC-SLP Speech-Language Pathologist [facility] [phone] Arnette Felts)  Woodroe Chen 02/11/2022, 12:34 PM

## 2022-02-11 NOTE — Assessment & Plan Note (Addendum)
This echocardiogram showed a normal EF of 50 to 55%. Continue metoprolol and lisinopril.  He has been on spironolactone.

## 2022-02-12 ENCOUNTER — Observation Stay: Payer: Medicare (Managed Care)

## 2022-02-12 DIAGNOSIS — F0390 Unspecified dementia without behavioral disturbance: Secondary | ICD-10-CM

## 2022-02-12 DIAGNOSIS — G459 Transient cerebral ischemic attack, unspecified: Secondary | ICD-10-CM | POA: Diagnosis not present

## 2022-02-12 DIAGNOSIS — I2510 Atherosclerotic heart disease of native coronary artery without angina pectoris: Secondary | ICD-10-CM | POA: Diagnosis not present

## 2022-02-12 DIAGNOSIS — I6502 Occlusion and stenosis of left vertebral artery: Secondary | ICD-10-CM | POA: Diagnosis not present

## 2022-02-12 DIAGNOSIS — I5022 Chronic systolic (congestive) heart failure: Secondary | ICD-10-CM | POA: Diagnosis not present

## 2022-02-12 DIAGNOSIS — I2583 Coronary atherosclerosis due to lipid rich plaque: Secondary | ICD-10-CM

## 2022-02-12 LAB — ECHOCARDIOGRAM COMPLETE
AR max vel: 1.09 cm2
AV Area VTI: 1.19 cm2
AV Area mean vel: 1.07 cm2
AV Mean grad: 8 mmHg
AV Peak grad: 12.7 mmHg
Ao pk vel: 1.79 m/s
Calc EF: 31.3 %
S' Lateral: 3.59 cm
Single Plane A2C EF: 34.3 %
Single Plane A4C EF: 32.2 %

## 2022-02-12 LAB — LIPID PANEL
Cholesterol: 100 mg/dL (ref 0–200)
HDL: 36 mg/dL — ABNORMAL LOW (ref >40)
LDL Cholesterol: 51 mg/dL (ref 0–99)
Total CHOL/HDL Ratio: 2.8 RATIO
Triglycerides: 64 mg/dL (ref <150)
VLDL: 13 mg/dL (ref 0–40)

## 2022-02-12 MED ORDER — IOHEXOL 350 MG/ML SOLN
75.0000 mL | Freq: Once | INTRAVENOUS | Status: AC | PRN
  Administered 2022-02-12: 75 mL via INTRAVENOUS

## 2022-02-12 NOTE — Discharge Summary (Signed)
Physician Discharge Summary   Patient: Christopher Cortez MRN: [id number] DOB: [birth year]  Admit date:     02/11/2022  Discharge date: 02/12/22  Discharge Physician: Alford Highland   PCP: [facility]   Recommendations at discharge:   Follow-up with the pace program  Discharge Diagnoses: Principal Problem:   TIA (transient ischemic attack) Active Problems:   Vertebral artery stenosis, left   CAD (coronary artery disease)   Dementia (HCC)   Chronic systolic CHF (congestive heart failure) [facility])    Hospital Course: Patient was brought in for an observation on 02/11/2022 and discharged on 02/12/2022.  He came in with weakness.  MRI of the brain was negative for acute event.  CT angio of the head and neck was negative for large vessel occlusion but did have a stenosis of the left ICA supraclinoid segment origin of the left vertebral artery.  I offered to do aspirin for 21 days but the patient's wife declined at this point.  Echocardiogram did not show any source of stroke.  Ejection fraction normal range.  Assessment and Plan: * TIA (transient ischemic attack) MRI negative for stroke.  Patient's wife would like to continue on aspirin.  Since LDL 51 no need for additional medications.  Already on Lipitor 80.  Physical therapy recommending getting back to the pace program.  Vertebral artery stenosis, left Since this is a small artery we usually do not do any interventions.  Offered to add aspirin for 21 days to the Plavix but patient's wife declined this at this time.  CAD (coronary artery disease) History of coronary artery disease with ischemic cardiomyopathy Continue metoprolol, spironolactone and lisinopril.  Plavix and atorvastatin  Dementia (HCC) Stable Continue mirtazapine, trazodone and Namenda  Chronic systolic CHF (congestive heart failure) (HCC) This echocardiogram showed a normal EF of 50 to 55%. Continue metoprolol and lisinopril and spironolactone.          Consultants: None Procedures performed: None Disposition: Back to pace program Diet recommendation:  Cardiac diet DISCHARGE MEDICATION: Allergies as of 02/12/2022   No Known Allergies      Medication List     STOP taking these medications    Melatonin 3 MG Caps   spironolactone 25 MG tablet Commonly known as: ALDACTONE       TAKE these medications    acetaminophen 650 MG CR tablet Commonly known as: TYLENOL Take 650 mg by mouth 2 (two) times daily.   atorvastatin 80 MG tablet Commonly known as: LIPITOR Take 80 mg by mouth daily.   clopidogrel 75 MG tablet Commonly known as: PLAVIX Take 75 mg by mouth daily.   lisinopril 5 MG tablet Commonly known as: ZESTRIL Take 5 mg by mouth daily.   memantine 10 MG tablet Commonly known as: NAMENDA Take 10 mg by mouth daily.   metoprolol succinate 25 MG 24 hr tablet Commonly known as: TOPROL-XL Take 12.5 mg by mouth daily.   midodrine 2.5 MG tablet Commonly known as: PROAMATINE Take 2.5 mg by mouth 3 (three) times daily.   mirtazapine 7.5 MG tablet Commonly known as: REMERON Take 7.5 mg by mouth at bedtime.   multivitamin with minerals tablet Take 1 tablet by mouth daily.   pantoprazole 40 MG tablet Commonly known as: PROTONIX Take 40 mg by mouth daily.   senna 8.6 MG Tabs tablet Commonly known as: SENOKOT Take 2 tablets by mouth daily.   tamsulosin 0.4 MG Caps capsule Commonly known as: FLOMAX Take 0.4 mg by mouth daily after supper.  traZODone 50 MG tablet Commonly known as: DESYREL Take 25 mg by mouth at bedtime as needed.   Ventolin HFA 108 (90 Base) MCG/ACT inhaler Generic drug: albuterol Inhale 2 puffs into the lungs every 6 (six) hours as needed for wheezing or shortness of breath.        Discharge Exam: Physical Exam HENT:     Head: Normocephalic.     Mouth/Throat:     Pharynx: No oropharyngeal exudate.  Eyes:     General: Lids are normal.     Conjunctiva/sclera: Conjunctivae  normal.  Cardiovascular:     Rate and Rhythm: Normal rate and regular rhythm.     Heart sounds: Normal heart sounds, S1 normal and S2 normal.  Pulmonary:     Breath sounds: No decreased breath sounds, wheezing, rhonchi or rales.  Abdominal:     Palpations: Abdomen is soft.     Tenderness: There is no abdominal tenderness.  Musculoskeletal:     Right lower leg: No swelling.     Left lower leg: No swelling.  Skin:    General: Skin is warm.     Findings: No rash.  Neurological:     Mental Status: He is alert.     Comments: Able to follow some commands with prompting.  Able to straight leg raise.  Able to lift his arms up.     BP 143/81, pulse 83, respirations 18, pulse ox 94% on room air and temperature 98.4 Condition at discharge: stable  The results of significant diagnostics from this hospitalization (including imaging, microbiology, ancillary and laboratory) are listed below for reference.   Imaging Studies: CT ANGIO HEAD NECK W WO CM  Result Date: 02/12/2022 CLINICAL DATA:  81 year old male with right side facial droop and slurred speech. No acute finding by brain MRI yesterday. EXAM: CT ANGIOGRAPHY HEAD AND NECK TECHNIQUE: Multidetector CT imaging of the head and neck was performed using the standard protocol during bolus administration of intravenous contrast. Multiplanar CT image reconstructions and MIPs were obtained to evaluate the vascular anatomy. Carotid stenosis measurements (when applicable) are obtained utilizing NASCET criteria, using the distal internal carotid diameter as the denominator. RADIATION DOSE REDUCTION: This exam was performed according to the departmental dose-optimization program which includes automated exposure control, adjustment of the mA and/or kV according to patient size and/or use of iterative reconstruction technique. CONTRAST:  96mL OMNIPAQUE IOHEXOL 350 MG/ML SOLN COMPARISON:  Brain MRI and head CT 02/11/2022. FINDINGS: CT HEAD Brain: Stable non  contrast CT appearance of the brain. Calvarium and skull base: No acute osseous abnormality identified. Paranasal sinuses: Visualized paranasal sinuses and mastoids are stable and well aerated. Orbits: No acute orbit or scalp soft tissue finding. CTA NECK Skeleton: Carious dentition. Mild motion artifact at the mandible. Widespread cervical spine degeneration. No acute osseous abnormality identified. Upper chest: Mild respiratory motion, otherwise negative. Other neck: Motion artifact at the glottis.  Otherwise negative. Aortic arch: 3 vessel arch configuration. Calcified aortic atherosclerosis. Right carotid system: Tortuous brachiocephalic artery without plaque or stenosis. Normal right CCA. Soft and calcified plaque at the right ICA origin without stenosis. Left carotid system: Mildly tortuous left CCA without plaque or stenosis. Mild soft and calcified plaque at the left ICA origin and bulb without stenosis. Vertebral arteries: Proximal right subclavian artery soft and calcified plaque without significant stenosis. Plaque at the right vertebral artery origin which appear shared with thyrocervical branches, no significant stenosis. Right vertebral artery appears mildly non dominant and patent to the skull base with  no significant stenosis. Somewhat bulky plaque in the proximal left subclavian artery but no significant stenosis. However, plaque with moderate to severe stenosis at the left vertebral artery origin on series 12, image 164. Tortuous left V1 segment and mildly dominant left vertebral artery is patent to the skull base without additional stenosis. CTA HEAD Posterior circulation: Dominant left V4 segment with calcified plaque but no significant stenosis. No right V4 or vertebrobasilar junction stenosis. Normal right PICA origin. Left AICA appears dominant and patent. Patent basilar artery without stenosis. Normal SCA and PCA origins. Posterior communicating arteries are diminutive or absent. Bilateral  PCA branches are patent. Mild irregularity on the left. Anterior circulation: Both ICA siphons are patent. Bilateral siphon calcified plaque is moderate. And there is moderate to severe supraclinoid stenosis on the left (series 11, image 101 and series 13, image 84. Right siphon with only mild stenosis. Patent carotid termini. Patent MCA and dominant left ACA A1 origins. Right A1 is diminutive or absent. Anterior communicating artery and bilateral ACA branches are within normal limits. Left MCA M1 segment and trifurcation are patent. Right MCA M1 segment and bifurcation are patent. Bilateral MCA branches are within normal limits. Venous sinuses: Early contrast timing but grossly patent. Anatomic variants: Mildly dominant left vertebral artery. Dominant left and diminutive or absent right ACA A1 segments. Review of the MIP images confirms the above findings IMPRESSION: 1. Negative for large vessel occlusion. 2. Positive for Moderate to Severe Atherosclerotic stenoses of the: - Left ICA supraclinoid segment. - origin of the Left Vertebral Artery (mildly dominant). 3. Stable non contrast CT appearance of the brain. 4. Carious dentition. Electronically Signed   By: Odessa Fleming M.D.   On: 02/12/2022 11:29   ECHOCARDIOGRAM COMPLETE  Result Date: 02/12/2022    ECHOCARDIOGRAM REPORT   Patient Name:   ZAEDYN COVIN Date of Exam: 02/11/2022 Medical Rec #:  [id number]    Height:       65.0 in Accession #:    [id number]   Weight:       170.0 lb Date of Birth:  [birth year]    BSA:          1.846 m Patient Age:    81 years     BP:           145/84 mmHg Patient Gender: M            HR:           79 bpm. Exam Location:  [facility] Procedure: 2D Echo, Cardiac Doppler, Color Doppler and Intracardiac            Opacification Agent Indications:     Stroke I63.9  History:         Patient has no prior history of Echocardiogram examinations.                  CHF, CAD; TIA.  Sonographer:     Eulah Pont RDCS Referring Phys:  [id number] AGBATA  Diagnosing Phys: Adrian Blackwater IMPRESSIONS  1. Left ventricular ejection fraction, by estimation, is 50 to 55%. The left ventricle has low normal function. The left ventricle has no regional wall motion abnormalities. Left ventricular diastolic parameters are consistent with Grade I diastolic dysfunction (impaired relaxation).  2. Right ventricular systolic function is normal. The right ventricular size is normal.  3. The mitral valve is normal in structure. Mild mitral valve regurgitation. No evidence of mitral stenosis.  4. The aortic valve is normal in structure. Aortic valve  regurgitation is not visualized. Aortic valve sclerosis/calcification is present, without any evidence of aortic stenosis.  5. The inferior vena cava is normal in size with greater than 50% respiratory variability, suggesting right atrial pressure of 3 mmHg. FINDINGS  Left Ventricle: Left ventricular ejection fraction, by estimation, is 50 to 55%. The left ventricle has low normal function. The left ventricle has no regional wall motion abnormalities. Definity contrast agent was given IV to delineate the left ventricular endocardial borders. The left ventricular internal cavity size was normal in size. There is no left ventricular hypertrophy. Left ventricular diastolic parameters are consistent with Grade I diastolic dysfunction (impaired relaxation). Right Ventricle: The right ventricular size is normal. No increase in right ventricular wall thickness. Right ventricular systolic function is normal. Left Atrium: Left atrial size was normal in size. Right Atrium: Right atrial size was normal in size. Pericardium: There is no evidence of pericardial effusion. Mitral Valve: The mitral valve is normal in structure. Mild mitral valve regurgitation. No evidence of mitral valve stenosis. Tricuspid Valve: The tricuspid valve is normal in structure. Tricuspid valve regurgitation is mild . No evidence of tricuspid stenosis. Aortic Valve: The aortic  valve is normal in structure. Aortic valve regurgitation is not visualized. Aortic valve sclerosis/calcification is present, without any evidence of aortic stenosis. Aortic valve mean gradient measures 8.0 mmHg. Aortic valve peak  gradient measures 12.7 mmHg. Aortic valve area, by VTI measures 1.19 cm. Pulmonic Valve: The pulmonic valve was normal in structure. Pulmonic valve regurgitation is not visualized. No evidence of pulmonic stenosis. Aorta: The aortic root is normal in size and structure. Venous: The inferior vena cava is normal in size with greater than 50% respiratory variability, suggesting right atrial pressure of 3 mmHg. IAS/Shunts: No atrial level shunt detected by color flow Doppler.  LEFT VENTRICLE PLAX 2D LVIDd:         4.92 cm LVIDs:         3.59 cm LV PW:         1.19 cm LV IVS:        1.05 cm LVOT diam:     2.00 cm LV SV:         45 LV SV Index:   24 LVOT Area:     3.14 cm  LV Volumes (MOD) LV vol d, MOD A2C: 61.8 ml LV vol d, MOD A4C: 85.3 ml LV vol s, MOD A2C: 40.6 ml LV vol s, MOD A4C: 57.8 ml LV SV MOD A2C:     21.2 ml LV SV MOD A4C:     85.3 ml LV SV MOD BP:      23.0 ml RIGHT VENTRICLE RV S prime:     7.73 cm/s TAPSE (M-mode): 1.6 cm LEFT ATRIUM             Index        RIGHT ATRIUM           Index LA diam:        3.80 cm 2.06 cm/m   RA Area:     17.60 cm LA Vol (A2C):   70.9 ml 38.41 ml/m  RA Volume:   42.00 ml  22.75 ml/m LA Vol (A4C):   61.0 ml 33.04 ml/m LA Biplane Vol: 65.5 ml 35.48 ml/m  AORTIC VALVE AV Area (Vmax):    1.09 cm AV Area (Vmean):   1.07 cm AV Area (VTI):     1.19 cm AV Vmax:  178.50 cm/s AV Vmean:          135.500 cm/s AV VTI:            0.377 m AV Peak Grad:      12.7 mmHg AV Mean Grad:      8.0 mmHg LVOT Vmax:         62.00 cm/s LVOT Vmean:        46.300 cm/s LVOT VTI:          0.143 m LVOT/AV VTI ratio: 0.38  AORTA Ao Root diam: 3.60 cm Ao Asc diam:  3.60 cm  SHUNTS Systemic VTI:  0.14 m Systemic Diam: 2.00 cm Adrian BlackwaterShaukat Khan Electronically signed by  Adrian BlackwaterShaukat Khan Signature Date/Time: 02/12/2022/8:57:16 AM    Final    MR BRAIN WO CONTRAST  Result Date: 02/11/2022 CLINICAL DATA:  Transient ischemic attack EXAM: MRI HEAD WITHOUT CONTRAST TECHNIQUE: Multiplanar, multiecho pulse sequences of the brain and surrounding structures were obtained without intravenous contrast. COMPARISON:  None Available. FINDINGS: Brain: No acute infarct, mass effect or extra-axial collection. No acute or chronic hemorrhage. There is multifocal hyperintense T2-weighted signal within the white matter. Generalized volume loss. The midline structures are normal. Vascular: Major flow voids are preserved. Skull and upper cervical spine: Normal calvarium and skull base. Visualized upper cervical spine and soft tissues are normal. Sinuses/Orbits:No paranasal sinus fluid levels or advanced mucosal thickening. No mastoid or middle ear effusion. Normal orbits. IMPRESSION: 1. No acute intracranial abnormality. 2. Findings of chronic small vessel ischemia and volume loss. Electronically Signed   By: Deatra RobinsonKevin  Herman M.D.   On: 02/11/2022 23:42   DG Chest Portable 1 View  Result Date: 02/11/2022 CLINICAL DATA:  Generalized weakness, evaluate for pneumonia EXAM: PORTABLE CHEST 1 VIEW COMPARISON:  None Available. FINDINGS: Transverse diameter of Edwyna ShellHart is increased. There are no signs of pulmonary edema or focal pulmonary consolidation. There is no pleural effusion or pneumothorax. Deformities are noted in few of the left ribs suggesting possible old healed fractures. IMPRESSION: Cardiomegaly.  No focal pulmonary infiltrates are seen. Electronically Signed   By: Ernie AvenaPalani  Rathinasamy M.D.   On: 02/11/2022 08:32   CT HEAD WO CONTRAST (5MM)  Result Date: 02/11/2022 CLINICAL DATA:  Neuro deficit, acute, stroke suspected EXAM: CT HEAD WITHOUT CONTRAST TECHNIQUE: Contiguous axial images were obtained from the base of the skull through the vertex without intravenous contrast. RADIATION DOSE REDUCTION: This  exam was performed according to the departmental dose-optimization program which includes automated exposure control, adjustment of the mA and/or kV according to patient size and/or use of iterative reconstruction technique. COMPARISON:  CT head 03/16/2021 FINDINGS: Brain: No evidence of acute intracranial hemorrhage or extra-axial collection.No concerning mass effect. Basal cisterns are patent.The ventricles are unchanged in size.Scattered subcortical and periventricular white matter hypodensities, nonspecific but likely sequela of chronic small vessel ischemic disease.Mild cerebral atrophy Vascular: No hyperdense vessel or unexpected calcification. Skull: Negative for skull fracture. Sinuses/Orbits: Mild ethmoid air cell mucosal thickening. Mastoid air cells are clear. Orbits are unremarkable. Other: None. IMPRESSION: No acute intracranial abnormality. Unchanged mild sequela of chronic small vessel ischemic disease and cerebral atrophy. Electronically Signed   By: Caprice RenshawJacob  Kahn M.D.   On: 02/11/2022 08:22    Microbiology: Results for orders placed or performed during the hospital encounter of 02/11/22  SARS Coronavirus 2 by RT PCR (hospital order, performed in [facility] lab) *cepheid single result test* Anterior Nasal Swab     Status: None   Collection Time: 02/11/22  9:21 AM   Specimen: Anterior Nasal Swab  Result Value Ref Range Status   SARS Coronavirus 2 by RT PCR NEGATIVE NEGATIVE Final    Comment: (NOTE) SARS-CoV-2 target nucleic acids are NOT DETECTED.  The SARS-CoV-2 RNA is generally detectable in upper and lower respiratory specimens during the acute phase of infection. The lowest concentration of SARS-CoV-2 viral copies this assay can detect is 250 copies / mL. A negative result does not preclude SARS-CoV-2 infection and should not be used as the sole basis for treatment or other patient management decisions.  A negative result may occur with improper specimen collection /  handling, submission of specimen other than nasopharyngeal swab, presence of viral mutation(s) within the areas targeted by this assay, and inadequate number of viral copies (<250 copies / mL). A negative result must be combined with clinical observations, patient history, and epidemiological information.  Fact Sheet for Patients:   RoadLapTop.co.za  Fact Sheet for Healthcare Providers: http://kim-miller.com/  This test is not yet approved or  cleared by the [place] FDA and has been authorized for detection and/or diagnosis of SARS-CoV-2 by FDA under an Emergency Use Authorization (EUA).  This EUA will remain in effect (meaning this test can be used) for the duration of the COVID-19 declaration under Section 564(b)(1) of the Act, 21 U.S.C. section 360bbb-3(b)(1), unless the authorization is terminated or revoked sooner.  Performed at [facility], [address] [place], [place] [address]     Labs: CBC: Recent Labs  Lab 02/11/22 0759  WBC 5.4  NEUTROABS 3.1  HGB 14.1  HCT 42.1  MCV 87.9  PLT 202   Basic Metabolic Panel: Recent Labs  Lab 02/11/22 0759  NA 135  K 4.4  CL 103  CO2 25  GLUCOSE 107*  BUN 6*  CREATININE 0.92  CALCIUM 9.0   Liver Function Tests: Recent Labs  Lab 02/11/22 0759  AST 21  ALT 16  ALKPHOS 52  BILITOT 1.0  PROT 6.9  ALBUMIN 3.8   CBG: No results for input(s): "GLUCAP" in the last 168 hours.  Discharge time spent: greater than 30 minutes.  Signed: Alford Highland, MD [facility] 02/12/2022

## 2022-02-12 NOTE — Progress Notes (Signed)
SLP Cancellation Note  Patient Details Name: Christopher Cortez MRN: [id number] DOB: 11-10-1940   Cancelled treatment:       Reason Eval/Treat Not Completed: SLP screened, no needs identified, will sign off (chart reviewed; consulted NSG then met w/ Wife and pt in room. Pt has a Baseline of Dementia.) Pt was admitted w/ concern for new neuro changes. MRI revealed "No acute intracranial abnormality. 2. Findings of chronic small vessel ischemia and volume loss.". Pt has a Baseline of Dementia. He is followed by the [facility] program per Wife.  Pt, Wife and NSG denied any difficulty swallowing and is currently on a regular diet; tolerates swallowing pills w/ water per NSG. Pt conversed w/ Wife and NSG/SLP w/out new expressive/receptive deficits noted; Wife and pt denied any new speech-language deficits. Speech muttered, intelligible. He was able to make his basic needs/wants known- coffee, water (which was provided).  No further skilled ST services indicated as pt appears at his baseline w/ Dementia. Pt agreed. NSG to reconsult if any new change in status while admitted.       Orinda Kenner, MS, CCC-SLP Speech Language Pathologist Rehab Services; [place] [phone] (ascom)  Stephinie Battisti 02/12/2022, 8:51 AM

## 2022-02-12 NOTE — Assessment & Plan Note (Signed)
Since this is a small artery we usually do not do any interventions.  Offered to add aspirin for 21 days to the Plavix but patient's wife declined this at this time.

## 2022-10-28 ENCOUNTER — Ambulatory Visit: Payer: Medicare (Managed Care) | Admitting: Physician Assistant

## 2022-10-28 ENCOUNTER — Encounter: Payer: Self-pay | Admitting: Physician Assistant

## 2022-10-28 VITALS — BP 109/64 | HR 72 | Ht 66.0 in | Wt 180.0 lb

## 2022-10-28 DIAGNOSIS — N5089 Other specified disorders of the male genital organs: Secondary | ICD-10-CM

## 2022-10-28 LAB — URINALYSIS, COMPLETE
Bilirubin, UA: NEGATIVE
Glucose, UA: NEGATIVE
Ketones, UA: NEGATIVE
Leukocytes,UA: NEGATIVE
Nitrite, UA: NEGATIVE
Protein,UA: NEGATIVE
RBC, UA: NEGATIVE
Specific Gravity, UA: 1.015 (ref 1.005–1.030)
Urobilinogen, Ur: 1 mg/dL (ref 0.2–1.0)
pH, UA: 6 (ref 5.0–7.5)

## 2022-10-28 LAB — MICROSCOPIC EXAMINATION

## 2022-10-28 NOTE — Progress Notes (Signed)
10/28/2022 2:14 PM   Christopher Cortez 10-08-1940 [id number]  CC: Chief Complaint  Patient presents with   Testicle Pain   HPI: Christopher Cortez is a 82 y.o. 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  He is accompanied today by his wife, who contributes to HPI.   Today she reports progressive enlargement of his left hemiscrotal swelling.  It is so large that it is interfering with his ability to stay dry when he voids.  He is having increasing episodes of urinary incontinence.  He also reports occasional discomfort in the scrotum.  She denies redness or severe pain.  She notes they attempted a condom catheter during a prior hospitalization, but were unsuccessful with this.  In-office UA and microscopy today pan negative.  PMH: No past medical history on file.  Surgical History: No past surgical history on file.  Home Medications:  Allergies as of 10/28/2022   No Known Allergies      Medication List        Accurate as of October 28, 2022  2:14 PM. If you have any questions, ask your nurse or doctor.          acetaminophen 650 MG CR tablet Commonly known as: TYLENOL Take 650 mg by mouth 2 (two) times daily.   atorvastatin 80 MG tablet Commonly known as: LIPITOR Take 80 mg by mouth daily.   clopidogrel 75 MG tablet Commonly known as: PLAVIX Take 75 mg by mouth daily.   lisinopril 5 MG tablet Commonly known as: ZESTRIL Take 5 mg by mouth daily.   memantine 10 MG tablet Commonly known as: NAMENDA Take 10 mg by mouth daily.   metoprolol succinate 25 MG 24 hr tablet Commonly known as: TOPROL-XL Take 12.5 mg by mouth daily.   midodrine 2.5 MG tablet Commonly known as: PROAMATINE Take 2.5 mg by mouth 3 (three) times daily.   mirtazapine 7.5 MG tablet Commonly known as: REMERON Take 7.5 mg by mouth at bedtime.   multivitamin with minerals tablet Take 1  tablet by mouth daily.   pantoprazole 40 MG tablet Commonly known as: PROTONIX Take 40 mg by mouth daily.   senna 8.6 MG Tabs tablet Commonly known as: SENOKOT Take 2 tablets by mouth daily.   tamsulosin 0.4 MG Caps capsule Commonly known as: FLOMAX Take 0.4 mg by mouth daily after supper.   traZODone 50 MG tablet Commonly known as: DESYREL Take 25 mg by mouth at bedtime as needed.   Ventolin HFA 108 (90 Base) MCG/ACT inhaler Generic drug: albuterol Inhale 2 puffs into the lungs every 6 (six) hours as needed for wheezing or shortness of breath.        Allergies:  No Known Allergies  Family History: No family history on file.  Social History:   reports that he has quit smoking. His smoking use included cigarettes. He has never used smokeless tobacco. No history on file for alcohol use and drug use.  Physical Exam: BP 109/64   Pulse 72   Ht  (1.676 m)   Wt 180 lb (81.6 kg)   BMI 29.05 kg/m   Constitutional:  Alert, no acute distress, nontoxic appearing HEENT: [place], AT Cardiovascular: No clubbing, cyanosis, or edema Respiratory: Normal respiratory effort, no increased work of breathing Skin: No rashes, bruises or suspicious lesions GU: Descended right testicle.  Unable to palpate the left testicle due to significant left hemiscrotal fullness/enlargement.  Scrotum is intact with some mild dependent erythema, but no purulence, fluctuance, or crepitus. Neurologic: Grossly intact, no focal deficits, moving all 4 extremities Psychiatric: Normal mood and affect  Laboratory Data: Results for orders placed or performed in visit on 10/28/22  Microscopic Examination   Urine  Result Value Ref Range   WBC, UA 0-5 0 - 5 /hpf   RBC, Urine 0-2 0 - 2 /hpf   Epithelial Cells (non renal) 0-10 0 - 10 /hpf   Bacteria, UA Few None seen/Few  Urinalysis, Complete  Result Value Ref Range   Specific Gravity, UA 1.015 1.005 - 1.030   pH, UA 6.0 5.0 - 7.5   Color, UA Yellow  Yellow   Appearance Ur Clear Clear   Leukocytes,UA Negative Negative   Protein,UA Negative Negative/Trace   Glucose, UA Negative Negative   Ketones, UA Negative Negative   RBC, UA Negative Negative   Bilirubin, UA Negative Negative   Urobilinogen, Ur 1.0 0.2 - 1.0 mg/dL   Nitrite, UA Negative Negative   Microscopic Examination See below:    Assessment & Plan:   1. Scrotal swelling Progressive enlargement of his left hemiscrotum.  Strongly suspect interval worsening in his known left inguinal hernia however his wife is fairly adamant today that she was previously told this was just a cyst.  Will obtain a scrotal ultrasound for confirmation.  We discussed consideration of general surgery follow-up to consider hernia repair, however she expressed concerns about pursuing surgery given his dementia.  I think this is reasonable.  Note: We contacted [facility] regarding scrotal ultrasound order and were advised to place the order, as they will approve it later on in the process. - Urinalysis, Complete - US SCROTUM W/DOPPLER; Future   Return for Will call with results.  Carman Ching, PA-C  [facility] [place] [address], [address] [place], [place] [address] [phone]

## 2022-11-04 ENCOUNTER — Ambulatory Visit: Payer: Medicare (Managed Care)

## 2022-11-05 ENCOUNTER — Ambulatory Visit
Admission: RE | Admit: 2022-11-05 | Discharge: 2022-11-05 | Disposition: A | Discharge status: Home / Self Care | Payer: Medicare (Managed Care) | Source: Ambulatory Visit | Attending: Physician Assistant | Admitting: Physician Assistant

## 2022-11-05 DIAGNOSIS — N5089 Other specified disorders of the male genital organs: Secondary | ICD-10-CM

## 2022-11-24 ENCOUNTER — Encounter: Payer: Self-pay | Admitting: Emergency Medicine

## 2022-11-24 ENCOUNTER — Other Ambulatory Visit: Payer: Self-pay

## 2022-11-24 ENCOUNTER — Emergency Department
Admission: EM | Admit: 2022-11-24 | Discharge: 2022-11-24 | Discharge status: Against Medical Advice | Payer: Medicare (Managed Care) | Attending: Emergency Medicine | Admitting: Emergency Medicine

## 2022-11-24 DIAGNOSIS — Z5321 Procedure and treatment not carried out due to patient leaving prior to being seen by health care provider: Secondary | ICD-10-CM | POA: Diagnosis not present

## 2022-11-24 DIAGNOSIS — M79605 Pain in left leg: Secondary | ICD-10-CM | POA: Diagnosis not present

## 2022-11-24 DIAGNOSIS — M79604 Pain in right leg: Secondary | ICD-10-CM | POA: Insufficient documentation

## 2022-11-24 LAB — BASIC METABOLIC PANEL
Anion gap: 6 (ref 5–15)
BUN: 9 mg/dL (ref 8–23)
CO2: 24 mmol/L (ref 22–32)
Calcium: 8.5 mg/dL — ABNORMAL LOW (ref 8.9–10.3)
Chloride: 99 mmol/L (ref 98–111)
Creatinine, Ser: 0.99 mg/dL (ref 0.61–1.24)
GFR, Estimated: >60 mL/min (ref >60)
Glucose, Bld: 112 mg/dL — ABNORMAL HIGH (ref 70–99)
Potassium: 3.9 mmol/L (ref 3.5–5.1)
Sodium: 129 mmol/L — ABNORMAL LOW (ref 135–145)

## 2022-11-24 LAB — CBC
HCT: 40.7 % (ref 39.0–52.0)
Hemoglobin: 13.5 g/dL (ref 13.0–17.0)
MCH: 30.1 pg (ref 26.0–34.0)
MCHC: 33.2 g/dL (ref 30.0–36.0)
MCV: 90.8 fL (ref 80.0–100.0)
Platelets: 221 10*3/uL (ref 150–400)
RBC: 4.48 MIL/uL (ref 4.22–5.81)
RDW: 13.3 % (ref 11.5–15.5)
WBC: 7.3 10*3/uL (ref 4.0–10.5)
nRBC: 0 % (ref 0.0–0.2)

## 2022-11-24 NOTE — ED Triage Notes (Signed)
Pt to ED via [facility]. Pt states that he does not know why he is here and that he does not have any real complaints. Pt reports pain in his feet and legs. Pt appears to have swelling in his lower legs and feet but unsure if this is a new issue. EMS reports that pts wife told them that she was having trouble getting patient out of the chair this today and he has been walking slower than normal at home. Pt is currently in NAD. Pt is A & O x 1.

## 2022-11-24 NOTE — ED Notes (Signed)
Wife states she will follow up with [facility] provider in the morning states husband is unable to wait, advised to return to ED for any other concerns. Wife verbalized understanding. No distress noted at departure.

## 2022-11-24 NOTE — ED Triage Notes (Signed)
Pt in via EMS from home with c/o weakness. Pt wife could not get him up out of a chair. Per wife, pt has been slow walking the last few days and was worse today at the grocery store.   153/78, 100% RA, HR 79

## 2022-11-26 ENCOUNTER — Telehealth: Payer: Self-pay | Admitting: Emergency Medicine

## 2022-11-26 NOTE — Telephone Encounter (Signed)
Called patient due to left emergency department before provider exam to inquire about condition and follow up plans.  Wife says he is doing baseline today.  Just went to [facility] and was walking with walker.  Says the [facility] staff has checked him out and did review his labs done here.

## 2024-03-08 DEATH — deceased
